# Patient Record
Sex: Female | Born: 1960 | Race: White | Hispanic: No | Smoking: Current every day smoker
Health system: Southern US, Community
[De-identification: ages and names within clinical notes are randomized; demographics above are authoritative.]

## PROBLEM LIST (undated history)

## (undated) DIAGNOSIS — R0602 Shortness of breath: Secondary | ICD-10-CM

## (undated) DIAGNOSIS — M1990 Unspecified osteoarthritis, unspecified site: Secondary | ICD-10-CM

## (undated) DIAGNOSIS — R32 Unspecified urinary incontinence: Secondary | ICD-10-CM

## (undated) DIAGNOSIS — F32A Depression, unspecified: Secondary | ICD-10-CM

## (undated) DIAGNOSIS — F419 Anxiety disorder, unspecified: Secondary | ICD-10-CM

## (undated) DIAGNOSIS — G43909 Migraine, unspecified, not intractable, without status migrainosus: Secondary | ICD-10-CM

## (undated) DIAGNOSIS — K219 Gastro-esophageal reflux disease without esophagitis: Secondary | ICD-10-CM

## (undated) DIAGNOSIS — F329 Major depressive disorder, single episode, unspecified: Secondary | ICD-10-CM

## (undated) HISTORY — PX: TUBAL LIGATION: SHX77

## (undated) HISTORY — PX: TURBINATE RESECTION: SHX293

## (undated) HISTORY — PX: JOINT REPLACEMENT: SHX530

---

## 2001-03-02 ENCOUNTER — Encounter: Payer: Self-pay | Admitting: Internal Medicine

## 2001-03-02 ENCOUNTER — Encounter: Admission: RE | Admit: 2001-03-02 | Discharge: 2001-03-02 | Payer: Self-pay | Admitting: Internal Medicine

## 2001-03-10 ENCOUNTER — Emergency Department (HOSPITAL_COMMUNITY): Admission: EM | Admit: 2001-03-10 | Discharge: 2001-03-10 | Payer: Self-pay | Admitting: Emergency Medicine

## 2003-10-15 ENCOUNTER — Emergency Department (HOSPITAL_COMMUNITY): Admission: EM | Admit: 2003-10-15 | Discharge: 2003-10-15 | Payer: Self-pay

## 2008-06-06 HISTORY — PX: GREAT TOE ARTHRODESIS, INTERPHALANGEAL JOINT: SUR55

## 2010-11-15 ENCOUNTER — Emergency Department (HOSPITAL_COMMUNITY)
Admission: EM | Admit: 2010-11-15 | Discharge: 2010-11-15 | Disposition: A | Discharge status: Home / Self Care | Payer: BC Managed Care – PPO | Attending: Emergency Medicine | Admitting: Emergency Medicine

## 2010-11-15 ENCOUNTER — Emergency Department (HOSPITAL_COMMUNITY): Payer: BC Managed Care – PPO

## 2010-11-15 DIAGNOSIS — F329 Major depressive disorder, single episode, unspecified: Secondary | ICD-10-CM | POA: Insufficient documentation

## 2010-11-15 DIAGNOSIS — F3289 Other specified depressive episodes: Secondary | ICD-10-CM | POA: Insufficient documentation

## 2010-11-15 DIAGNOSIS — Z79899 Other long term (current) drug therapy: Secondary | ICD-10-CM | POA: Insufficient documentation

## 2010-11-15 DIAGNOSIS — R110 Nausea: Secondary | ICD-10-CM | POA: Insufficient documentation

## 2010-11-15 DIAGNOSIS — N12 Tubulo-interstitial nephritis, not specified as acute or chronic: Secondary | ICD-10-CM | POA: Insufficient documentation

## 2010-11-15 LAB — POCT PREGNANCY, URINE: Preg Test, Ur: NEGATIVE

## 2010-11-15 LAB — CBC
HCT: 37.7 % (ref 36.0–46.0)
Hemoglobin: 13 g/dL (ref 12.0–15.0)
MCH: 31.9 pg (ref 26.0–34.0)
MCHC: 34.5 g/dL (ref 30.0–36.0)
MCV: 92.6 fL (ref 78.0–100.0)
Platelets: 192 10*3/uL (ref 150–400)
RBC: 4.07 MIL/uL (ref 3.87–5.11)
RDW: 13.3 % (ref 11.5–15.5)
WBC: 11.6 10*3/uL — ABNORMAL HIGH (ref 4.0–10.5)

## 2010-11-15 LAB — COMPREHENSIVE METABOLIC PANEL
ALT: 10 U/L (ref 0–35)
AST: 19 U/L (ref 0–37)
Albumin: 3.5 g/dL (ref 3.5–5.2)
Alkaline Phosphatase: 77 U/L (ref 39–117)
BUN: 8 mg/dL (ref 6–23)
CO2: 23 mEq/L (ref 19–32)
Calcium: 9.1 mg/dL (ref 8.4–10.5)
Chloride: 103 mEq/L (ref 96–112)
Creatinine, Ser: 0.6 mg/dL (ref 0.4–1.2)
GFR calc Af Amer: >60 mL/min (ref >60)
GFR calc non Af Amer: >60 mL/min (ref >60)
Glucose, Bld: 92 mg/dL (ref 70–99)
Potassium: 3.6 mEq/L (ref 3.5–5.1)
Sodium: 136 mEq/L (ref 135–145)
Total Bilirubin: 0.3 mg/dL (ref 0.3–1.2)
Total Protein: 6.7 g/dL (ref 6.0–8.3)

## 2010-11-15 LAB — URINE MICROSCOPIC-ADD ON

## 2010-11-15 LAB — URINALYSIS, ROUTINE W REFLEX MICROSCOPIC
Bilirubin Urine: NEGATIVE
Glucose, UA: NEGATIVE mg/dL
Ketones, ur: NEGATIVE mg/dL
Nitrite: NEGATIVE
Protein, ur: NEGATIVE mg/dL
Specific Gravity, Urine: 1.008 (ref 1.005–1.030)
Urobilinogen, UA: 0.2 mg/dL (ref 0.0–1.0)
pH: 6.5 (ref 5.0–8.0)

## 2010-11-15 LAB — LIPASE, BLOOD: Lipase: 17 U/L (ref 11–59)

## 2010-11-15 MED ORDER — IOHEXOL 300 MG/ML  SOLN
100.0000 mL | Freq: Once | INTRAMUSCULAR | Status: AC | PRN
  Administered 2010-11-15: 100 mL via INTRAVENOUS

## 2010-11-17 LAB — URINE CULTURE
Colony Count: >=100000
Culture  Setup Time: 201206111240

## 2012-03-27 ENCOUNTER — Encounter (HOSPITAL_COMMUNITY): Payer: Self-pay | Admitting: Emergency Medicine

## 2012-03-27 ENCOUNTER — Emergency Department (HOSPITAL_COMMUNITY)
Admission: EM | Admit: 2012-03-27 | Discharge: 2012-03-28 | Disposition: A | Discharge status: Home / Self Care | Payer: BC Managed Care – PPO | Attending: Emergency Medicine | Admitting: Emergency Medicine

## 2012-03-27 DIAGNOSIS — Z79899 Other long term (current) drug therapy: Secondary | ICD-10-CM | POA: Insufficient documentation

## 2012-03-27 DIAGNOSIS — R197 Diarrhea, unspecified: Secondary | ICD-10-CM

## 2012-03-27 DIAGNOSIS — F3289 Other specified depressive episodes: Secondary | ICD-10-CM | POA: Insufficient documentation

## 2012-03-27 DIAGNOSIS — F329 Major depressive disorder, single episode, unspecified: Secondary | ICD-10-CM | POA: Insufficient documentation

## 2012-03-27 DIAGNOSIS — R1084 Generalized abdominal pain: Secondary | ICD-10-CM | POA: Insufficient documentation

## 2012-03-27 DIAGNOSIS — R109 Unspecified abdominal pain: Secondary | ICD-10-CM

## 2012-03-27 DIAGNOSIS — R112 Nausea with vomiting, unspecified: Secondary | ICD-10-CM

## 2012-03-27 DIAGNOSIS — K219 Gastro-esophageal reflux disease without esophagitis: Secondary | ICD-10-CM | POA: Insufficient documentation

## 2012-03-27 HISTORY — DX: Major depressive disorder, single episode, unspecified: F32.9

## 2012-03-27 HISTORY — DX: Depression, unspecified: F32.A

## 2012-03-27 HISTORY — DX: Gastro-esophageal reflux disease without esophagitis: K21.9

## 2012-03-27 LAB — COMPREHENSIVE METABOLIC PANEL
ALT: 19 U/L (ref 0–35)
AST: 26 U/L (ref 0–37)
Albumin: 4.1 g/dL (ref 3.5–5.2)
Alkaline Phosphatase: 78 U/L (ref 39–117)
BUN: 11 mg/dL (ref 6–23)
CO2: 27 mEq/L (ref 19–32)
Calcium: 9.7 mg/dL (ref 8.4–10.5)
Chloride: 102 mEq/L (ref 96–112)
Creatinine, Ser: 0.75 mg/dL (ref 0.50–1.10)
GFR calc Af Amer: >90 mL/min (ref >90)
GFR calc non Af Amer: >90 mL/min (ref >90)
Glucose, Bld: 106 mg/dL — ABNORMAL HIGH (ref 70–99)
Potassium: 3.6 mEq/L (ref 3.5–5.1)
Sodium: 138 mEq/L (ref 135–145)
Total Bilirubin: 0.2 mg/dL — ABNORMAL LOW (ref 0.3–1.2)
Total Protein: 7.3 g/dL (ref 6.0–8.3)

## 2012-03-27 LAB — CBC WITH DIFFERENTIAL/PLATELET
Basophils Absolute: 0 10*3/uL (ref 0.0–0.1)
Basophils Relative: 0 % (ref 0–1)
Eosinophils Absolute: 0.1 10*3/uL (ref 0.0–0.7)
Eosinophils Relative: 1 % (ref 0–5)
HCT: 40.7 % (ref 36.0–46.0)
Hemoglobin: 14.1 g/dL (ref 12.0–15.0)
Lymphocytes Relative: 8 % — ABNORMAL LOW (ref 12–46)
Lymphs Abs: 1.5 10*3/uL (ref 0.7–4.0)
MCH: 32.6 pg (ref 26.0–34.0)
MCHC: 34.6 g/dL (ref 30.0–36.0)
MCV: 94 fL (ref 78.0–100.0)
Monocytes Absolute: 1.7 10*3/uL — ABNORMAL HIGH (ref 0.1–1.0)
Monocytes Relative: 9 % (ref 3–12)
Neutro Abs: 16.2 10*3/uL — ABNORMAL HIGH (ref 1.7–7.7)
Neutrophils Relative %: 83 % — ABNORMAL HIGH (ref 43–77)
Platelets: 248 10*3/uL (ref 150–400)
RBC: 4.33 MIL/uL (ref 3.87–5.11)
RDW: 13.2 % (ref 11.5–15.5)
WBC: 19.5 10*3/uL — ABNORMAL HIGH (ref 4.0–10.5)

## 2012-03-27 LAB — LIPASE, BLOOD: Lipase: 26 U/L (ref 11–59)

## 2012-03-27 MED ORDER — HYDROMORPHONE HCL PF 1 MG/ML IJ SOLN
1.0000 mg | Freq: Once | INTRAMUSCULAR | Status: AC
  Administered 2012-03-27: 1 mg via INTRAVENOUS
  Filled 2012-03-27: qty 1

## 2012-03-27 MED ORDER — PROMETHAZINE HCL 25 MG/ML IJ SOLN
25.0000 mg | Freq: Once | INTRAMUSCULAR | Status: AC
  Administered 2012-03-27: 25 mg via INTRAVENOUS
  Filled 2012-03-27: qty 1

## 2012-03-27 MED ORDER — DICYCLOMINE HCL 10 MG/ML IM SOLN
20.0000 mg | Freq: Once | INTRAMUSCULAR | Status: AC
  Administered 2012-03-27: 20 mg via INTRAMUSCULAR
  Filled 2012-03-27: qty 2

## 2012-03-27 NOTE — ED Notes (Addendum)
Pt is aware of the need for urine sample, however states she is unable to provide one at this time.  

## 2012-03-27 NOTE — ED Notes (Signed)
[id number]<AV> Expected date:<BR> Expected time:<BR> Means of arrival:<BR> Comments:<BR> EMS/N/V/D

## 2012-03-27 NOTE — ED Provider Notes (Signed)
History     CSN: [id number]  Arrival date & time 03/27/12  2155   First MD Initiated Contact with Patient 03/27/12 2231      Chief Complaint  Patient presents with  . Emesis  . Diarrhea  . Abdominal Pain    (Consider location/radiation/quality/duration/timing/severity/associated sxs/prior treatment) HPI MCKINZEE JOURDEN is a 51 y.o. female presenting with nausea vomiting and diarrhea. She's also been associated abdominal pain. Patient works at [employer] in a not been feeling good all afternoon. She AF for lunch and about 1500 started having some nauseous feelings which point she took some Pepto-Bismol and promptly vomited. Since then she's had 10/10 crampy knifelike pain diffusely throughout her abdomen is been constant, she's had no blood in the vomit or diarrhea, she had 8-9 episodes of emesis, and 5 episodes of diarrhea. No chest pain, short of breath, fevers, chills.  Past Medical History  Diagnosis Date  . Depression   . GERD (gastroesophageal reflux disease)     History reviewed. No pertinent past surgical history.  No family history on file.  History  Substance Use Topics  . Smoking status: Not on file  . Smokeless tobacco: Not on file  . Alcohol Use:     OB History    Grav Para Term Preterm Abortions TAB SAB Ect Mult Living                  Review of Systems At least 10pt or greater review of systems completed and are negative except where specified in the HPI.  Allergies  Sulfa antibiotics  Home Medications   Current Outpatient Rx  Name Route Sig Dispense Refill  . BUPROPION HCL ER (SR) 150 MG PO TB12 Oral Take 150 mg by mouth 2 (two) times daily.    Marland Kitchen LANSOPRAZOLE 30 MG PO CPDR Oral Take 30 mg by mouth daily.    Marland Kitchen LORAZEPAM 1 MG PO TABS Oral Take 0.5 mg by mouth at bedtime as needed.    . OXYBUTYNIN CHLORIDE ER 10 MG PO TB24 Oral Take 10 mg by mouth 2 (two) times daily.    Marland Kitchen PAROXETINE HCL 20 MG PO TABS Oral Take 20 mg by mouth every  morning.      BP 143/80  Pulse 63  Temp 97.6 F (36.4 C) (Oral)  Resp 20  SpO2 99%  Physical Exam  Nursing notes reviewed.  Electronic medical record reviewed. VITAL SIGNS:   Filed Vitals:   03/27/12 2201  BP: 143/80  Pulse: 63  Temp: 97.6 F (36.4 C)  TempSrc: Oral  Resp: 20  SpO2: 99%   CONSTITUTIONAL: Awake, oriented, appears non-toxic HENT: Atraumatic, normocephalic, oral mucosa pink and moist, airway patent. Nares patent without drainage. External ears normal. EYES: Conjunctiva clear, EOMI, PERRLA NECK: Trachea midline, non-tender, supple CARDIOVASCULAR: Normal heart rate, Normal rhythm, No murmurs, rubs, gallops PULMONARY/CHEST: Clear to auscultation, no rhonchi, wheezes, or rales. Symmetrical breath sounds. Non-tender. ABDOMINAL: Non-distended, soft, non-tender - no rebound or guarding.  BS normal. NEUROLOGIC: Non-focal, moving all four extremities, no gross sensory or motor deficits. EXTREMITIES: No clubbing, cyanosis, or edema SKIN: Warm, Dry, No erythema, No rash  ED Course  Procedures (including critical care time)   Labs Reviewed  URINALYSIS, MICROSCOPIC ONLY  CBC WITH DIFFERENTIAL  COMPREHENSIVE METABOLIC PANEL  LIPASE, BLOOD   No results found.   1. Nausea and vomiting   2. Diarrhea   3. Abdominal cramping       MDM  Silas Flood [facility]  is a 51 y.o. female setting with likely gastroenteritis. Patient's abdomen is benign, she is nontoxic in appearance but certainly looks uncomfortable. After treatment with antiemetics and pain medicine, she's feeling much better like to go home. She does have an elevated white count of 19.5 however after extended out some vomiting I think this is likely nonspecific with a benign abdomen. CMP is unremarkable as is her urinalysis.  I explained the diagnosis and have given explicit precautions to return to the ER including appendicitis or any other new or worsening symptoms. The patient understands and accepts the  medical plan as it's been dictated and I have answered their questions. Discharge instructions concerning home care and prescriptions have been given.  The patient is STABLE and is discharged to home in good condition.          Jones Skene, MD 03/29/12 2030

## 2012-03-27 NOTE — ED Notes (Addendum)
Report given via EMS. Pt c/o N/V/D that started at 1900. Mid lower abdominal pain that started going down right leg. Initial VS 162 palpated HR 82 RR 22 O2 98% RA. 20 gauge right hand. 4 mg Zofran given en route. NSR on monitor.

## 2012-03-28 LAB — URINALYSIS, MICROSCOPIC ONLY
Bilirubin Urine: NEGATIVE
Glucose, UA: NEGATIVE mg/dL
Hgb urine dipstick: NEGATIVE
Ketones, ur: NEGATIVE mg/dL
Leukocytes, UA: NEGATIVE
Nitrite: NEGATIVE
Protein, ur: NEGATIVE mg/dL
Specific Gravity, Urine: 1.021 (ref 1.005–1.030)
Urobilinogen, UA: 0.2 mg/dL (ref 0.0–1.0)
pH: 7.5 (ref 5.0–8.0)

## 2012-03-28 MED ORDER — HYDROCODONE-ACETAMINOPHEN 5-325 MG PO TABS: 1.0000 | ORAL_TABLET | ORAL | Status: DC | PRN

## 2012-03-28 MED ORDER — PROMETHAZINE HCL 25 MG PO TABS: 25.0000 mg | ORAL_TABLET | Freq: Four times a day (QID) | ORAL | Status: DC | PRN

## 2012-08-29 ENCOUNTER — Telehealth: Payer: Self-pay | Admitting: Physician Assistant

## 2012-08-29 NOTE — Telephone Encounter (Signed)
NTBS so can decide appropriate dose of prednisone etc

## 2012-08-30 NOTE — Telephone Encounter (Signed)
Returned call to patient and recommended office visit.  She says she can not come in this week because of work.  Will continue to use topical cream she has at home.   if not better by Monday will make appt.

## 2012-09-19 ENCOUNTER — Telehealth: Payer: Self-pay | Admitting: Physician Assistant

## 2012-09-19 NOTE — Telephone Encounter (Signed)
Last rf 08/20/12.  Last ov 05/18/12.  Lorazepam 0.5mg  tid prn Need approval for controlled medication.

## 2012-09-19 NOTE — Telephone Encounter (Signed)
Approved. Give # 90 with 2 additional refills

## 2012-09-20 MED ORDER — LORAZEPAM 1 MG PO TABS: 0.5000 mg | ORAL_TABLET | Freq: Three times a day (TID) | ORAL | Status: DC | PRN

## 2012-09-20 NOTE — Telephone Encounter (Signed)
Medication refilled per protocol. 

## 2012-10-05 ENCOUNTER — Other Ambulatory Visit: Payer: Self-pay | Admitting: Family Medicine

## 2012-11-03 ENCOUNTER — Other Ambulatory Visit: Payer: Self-pay | Admitting: Family Medicine

## 2012-12-26 ENCOUNTER — Other Ambulatory Visit: Payer: Self-pay | Admitting: Physician Assistant

## 2012-12-27 ENCOUNTER — Encounter: Payer: Self-pay | Admitting: Family Medicine

## 2012-12-27 NOTE — Telephone Encounter (Signed)
Last refill 09/20/12 #90 + 2 refills  Is due for office visit.  Letter sent to schedule appt Need approval for controlled medication.Marland Kitchen

## 2012-12-27 NOTE — Telephone Encounter (Signed)
Rx called in #90 NO refills

## 2012-12-27 NOTE — Telephone Encounter (Signed)
Reviewed Chart. Approved. #90/0.

## 2013-01-14 ENCOUNTER — Ambulatory Visit (INDEPENDENT_AMBULATORY_CARE_PROVIDER_SITE_OTHER): Payer: BC Managed Care – PPO | Admitting: Physician Assistant

## 2013-01-14 ENCOUNTER — Encounter: Payer: Self-pay | Admitting: Physician Assistant

## 2013-01-14 VITALS — BP 124/80 | HR 80 | Temp 98.4°F | Resp 18 | Wt 132.0 lb

## 2013-01-14 DIAGNOSIS — H6692 Otitis media, unspecified, left ear: Secondary | ICD-10-CM

## 2013-01-14 DIAGNOSIS — H6690 Otitis media, unspecified, unspecified ear: Secondary | ICD-10-CM

## 2013-01-14 MED ORDER — AMOXICILLIN-POT CLAVULANATE 875-125 MG PO TABS: 1.0000 | ORAL_TABLET | Freq: Two times a day (BID) | ORAL | Status: DC

## 2013-01-14 NOTE — Progress Notes (Signed)
Patient ID: Kristy Butler MRN: [id number], DOB: [birth year], 52 y.o. Date of Encounter: 01/14/2013, 4:28 PM    Chief Complaint:  Chief Complaint  Patient presents with  . c/o bilat ear infections     HPI: 52 y.o. year old white female here with c/o ear ache.  Says left ear started bothering her last week. That has gottne worse. Now, right ear starting to feel the same as the left one.  Has had no mucus from nose, no rhinorrhea, no cough or chest congestion, no fever/chills, no sore thraot.  Has seen no drainage from ear.   Home Meds: See attached medication section for any medications that were entered at today's visit. The computer does not put those onto this list.The following list is a list of meds entered prior to today's visit.   Current Outpatient Prescriptions on File Prior to Visit  Medication Sig Dispense Refill  . buPROPion (WELLBUTRIN SR) 150 MG 12 hr tablet Take 150 mg by mouth 2 (two) times daily.      . lansoprazole (PREVACID) 30 MG capsule Take 30 mg by mouth daily.      Marland Kitchen LORazepam (ATIVAN) 0.5 MG tablet TAKE 1 TABLET 3 TIMES A DAY AS NEEDED FOR ANXIETY  90 tablet  0  . meloxicam (MOBIC) 7.5 MG tablet TAKE 1 TO 2 TABLETS BY MOUTH EVERY DAY AS NEEDED FOR JOINT PAIN  60 tablet  1  . omeprazole (PRILOSEC) 20 MG capsule TAKE 1 CAPSULE BY MOUTH DAILY  30 capsule  10  . oxybutynin (DITROPAN-XL) 10 MG 24 hr tablet Take 10 mg by mouth 2 (two) times daily.      Marland Kitchen PARoxetine (PAXIL) 20 MG tablet Take 20 mg by mouth every morning.      Marland Kitchen HYDROcodone-acetaminophen (NORCO/VICODIN) 5-325 MG per tablet Take 1-2 tablets by mouth every 4 (four) hours as needed for pain.  13 tablet  0  . promethazine (PHENERGAN) 25 MG tablet Take 1 tablet (25 mg total) by mouth every 6 (six) hours as needed for nausea.  15 tablet  0   No current facility-administered medications on file prior to visit.    Allergies:  Allergies  Allergen Reactions  . Sulfa Antibiotics       Review of Systems:  See HPI for pertinent ROS. All other ROS negative.    Physical Exam: Blood pressure 124/80, pulse 80, temperature 98.4 F (36.9 C), temperature source Oral, resp. rate 18, weight 132 lb (59.875 kg)., There is no height on file to calculate BMI. General: WNWD WF.  Appears in no acute distress. HEENT: Normocephalic, atraumatic, eyes without discharge, sclera non-icteric, nares are without discharge.External Ears: No tenderness with palpation. No erythema or edema of external ear.  Bilateral auditory canals clear with no edema, no erythema, TM's are without perforation. Left TM: There is streak of bright red erythema. The TM is dull, golden, c/w effusion. Right TM is clearer but also has streak of bright red erythema. Oral cavity moist, posterior pharynx without exudate, erythema, peritonsillar abscess, or post nasal drip.  Neck: Supple. No thyromegaly. No lymphadenopathy. Lungs: Clear bilaterally to auscultation without wheezes, rales, or rhonchi. Breathing is unlabored. Heart: Regular rhythm. No murmurs, rubs, or gallops. Msk:  Strength and tone normal for age. Extremities/Skin: Warm and dry.  No edema. No rashes or suspicious lesions. Neuro: Alert and oriented X 3. Moves all extremities spontaneously. Gait is normal. CNII-XII grossly in tact. Psych:  Responds to questions appropriately with a normal affect.  ASSESSMENT AND PLAN:  52 y.o. year old female with  1. Otitis media, left - amoxicillin-clavulanate (AUGMENTIN) 875-125 MG per tablet; Take 1 tablet by mouth 2 (two) times daily.  Dispense: 20 tablet; Refill: 0 Pt says she has no h/o ear infections. Take all of abx. Take otc decongestant. If sx do not resolve, f/u.   Murray Hodgkins [place], [place], [facility] 01/14/2013 4:28 PM

## 2013-01-23 ENCOUNTER — Telehealth: Payer: Self-pay | Admitting: Family Medicine

## 2013-01-23 MED ORDER — CEFDINIR 300 MG PO CAPS: 300.0000 mg | ORAL_CAPSULE | Freq: Two times a day (BID) | ORAL | Status: DC

## 2013-01-23 NOTE — Telephone Encounter (Signed)
Pt called and RX to pharmacy 

## 2013-01-23 NOTE — Telephone Encounter (Signed)
Take Omnicef. If symptoms do not Comploetely Resolve with this, then schedule f/u OV to re-examine.  Rx: Omnicef 300mg  one po BID x 10 days. # 20 / 0

## 2013-01-28 ENCOUNTER — Other Ambulatory Visit: Payer: Self-pay | Admitting: Physician Assistant

## 2013-01-28 NOTE — Telephone Encounter (Signed)
Approved for # 90 plus ONE additional refill but tell pt this should last her 2 months.

## 2013-01-28 NOTE — Telephone Encounter (Signed)
rx called in

## 2013-01-28 NOTE — Telephone Encounter (Signed)
Last OV for this pre-Epic.  Last refill 7/23 #90  OK refill ?

## 2013-04-01 ENCOUNTER — Encounter (HOSPITAL_COMMUNITY): Payer: Self-pay | Admitting: Emergency Medicine

## 2013-04-01 ENCOUNTER — Emergency Department (HOSPITAL_COMMUNITY)
Admission: EM | Admit: 2013-04-01 | Discharge: 2013-04-01 | Disposition: A | Discharge status: Home / Self Care | Payer: BC Managed Care – PPO | Attending: Emergency Medicine | Admitting: Emergency Medicine

## 2013-04-01 ENCOUNTER — Emergency Department (HOSPITAL_COMMUNITY): Payer: BC Managed Care – PPO

## 2013-04-01 DIAGNOSIS — M79609 Pain in unspecified limb: Secondary | ICD-10-CM

## 2013-04-01 DIAGNOSIS — R209 Unspecified disturbances of skin sensation: Secondary | ICD-10-CM | POA: Insufficient documentation

## 2013-04-01 DIAGNOSIS — M7989 Other specified soft tissue disorders: Secondary | ICD-10-CM | POA: Insufficient documentation

## 2013-04-01 DIAGNOSIS — R110 Nausea: Secondary | ICD-10-CM | POA: Insufficient documentation

## 2013-04-01 DIAGNOSIS — Z87448 Personal history of other diseases of urinary system: Secondary | ICD-10-CM | POA: Insufficient documentation

## 2013-04-01 DIAGNOSIS — F17200 Nicotine dependence, unspecified, uncomplicated: Secondary | ICD-10-CM | POA: Insufficient documentation

## 2013-04-01 DIAGNOSIS — H9209 Otalgia, unspecified ear: Secondary | ICD-10-CM | POA: Insufficient documentation

## 2013-04-01 DIAGNOSIS — Z882 Allergy status to sulfonamides status: Secondary | ICD-10-CM | POA: Insufficient documentation

## 2013-04-01 DIAGNOSIS — K219 Gastro-esophageal reflux disease without esophagitis: Secondary | ICD-10-CM | POA: Insufficient documentation

## 2013-04-01 DIAGNOSIS — M129 Arthropathy, unspecified: Secondary | ICD-10-CM | POA: Insufficient documentation

## 2013-04-01 DIAGNOSIS — F3289 Other specified depressive episodes: Secondary | ICD-10-CM | POA: Insufficient documentation

## 2013-04-01 DIAGNOSIS — F411 Generalized anxiety disorder: Secondary | ICD-10-CM | POA: Insufficient documentation

## 2013-04-01 DIAGNOSIS — M79602 Pain in left arm: Secondary | ICD-10-CM

## 2013-04-01 DIAGNOSIS — Z8709 Personal history of other diseases of the respiratory system: Secondary | ICD-10-CM | POA: Insufficient documentation

## 2013-04-01 DIAGNOSIS — Z8669 Personal history of other diseases of the nervous system and sense organs: Secondary | ICD-10-CM | POA: Insufficient documentation

## 2013-04-01 DIAGNOSIS — Z79899 Other long term (current) drug therapy: Secondary | ICD-10-CM | POA: Insufficient documentation

## 2013-04-01 DIAGNOSIS — F329 Major depressive disorder, single episode, unspecified: Secondary | ICD-10-CM | POA: Insufficient documentation

## 2013-04-01 LAB — CBC WITH DIFFERENTIAL/PLATELET
Basophils Absolute: 0 10*3/uL (ref 0.0–0.1)
Basophils Relative: 0 % (ref 0–1)
Eosinophils Absolute: 0.1 10*3/uL (ref 0.0–0.7)
Eosinophils Relative: 1 % (ref 0–5)
HCT: 35.7 % — ABNORMAL LOW (ref 36.0–46.0)
Hemoglobin: 12.3 g/dL (ref 12.0–15.0)
Lymphocytes Relative: 26 % (ref 12–46)
Lymphs Abs: 2.6 10*3/uL (ref 0.7–4.0)
MCH: 33 pg (ref 26.0–34.0)
MCHC: 34.5 g/dL (ref 30.0–36.0)
MCV: 95.7 fL (ref 78.0–100.0)
Monocytes Absolute: 0.7 10*3/uL (ref 0.1–1.0)
Monocytes Relative: 7 % (ref 3–12)
Neutro Abs: 6.5 10*3/uL (ref 1.7–7.7)
Neutrophils Relative %: 65 % (ref 43–77)
Platelets: 191 10*3/uL (ref 150–400)
RBC: 3.73 MIL/uL — ABNORMAL LOW (ref 3.87–5.11)
RDW: 13.3 % (ref 11.5–15.5)
WBC: 9.9 10*3/uL (ref 4.0–10.5)

## 2013-04-01 LAB — BASIC METABOLIC PANEL
BUN: 19 mg/dL (ref 6–23)
CO2: 29 mEq/L (ref 19–32)
Calcium: 8.9 mg/dL (ref 8.4–10.5)
Chloride: 105 mEq/L (ref 96–112)
Creatinine, Ser: 1.02 mg/dL (ref 0.50–1.10)
GFR calc Af Amer: 72 mL/min — ABNORMAL LOW (ref >90)
GFR calc non Af Amer: 62 mL/min — ABNORMAL LOW (ref >90)
Glucose, Bld: 93 mg/dL (ref 70–99)
Potassium: 3.6 mEq/L (ref 3.5–5.1)
Sodium: 139 mEq/L (ref 135–145)

## 2013-04-01 LAB — TROPONIN I
Troponin I: <0.3 ng/mL (ref <0.30)
Troponin I: <0.3 ng/mL (ref <0.30)

## 2013-04-01 MED ORDER — DEXAMETHASONE SODIUM PHOSPHATE 10 MG/ML IJ SOLN
10.0000 mg | Freq: Once | INTRAMUSCULAR | Status: AC
  Administered 2013-04-01: 10 mg via INTRAVENOUS
  Filled 2013-04-01: qty 1

## 2013-04-01 MED ORDER — OXYCODONE-ACETAMINOPHEN 5-325 MG PO TABS
1.0000 | ORAL_TABLET | Freq: Once | ORAL | Status: AC
  Administered 2013-04-01: 1 via ORAL
  Filled 2013-04-01: qty 1

## 2013-04-01 MED ORDER — SODIUM CHLORIDE 0.9 % IV BOLUS (SEPSIS)
1000.0000 mL | Freq: Once | INTRAVENOUS | Status: AC
  Administered 2013-04-01: 1000 mL via INTRAVENOUS

## 2013-04-01 MED ORDER — MORPHINE SULFATE 4 MG/ML IJ SOLN
4.0000 mg | Freq: Once | INTRAMUSCULAR | Status: AC
  Administered 2013-04-01: 4 mg via INTRAVENOUS
  Filled 2013-04-01: qty 1

## 2013-04-01 MED ORDER — ONDANSETRON HCL 4 MG/2ML IJ SOLN
4.0000 mg | Freq: Once | INTRAMUSCULAR | Status: AC
  Administered 2013-04-01: 4 mg via INTRAVENOUS
  Filled 2013-04-01: qty 2
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
## 2013-04-01 NOTE — ED Provider Notes (Signed)
Medical screening examination/treatment/procedure(s) were performed by non-physician practitioner and as supervising physician I was immediately available for consultation/collaboration.  EKG Interpretation     Ventricular Rate:  64 PR Interval:  146 QRS Duration: 80 QT Interval:  394 QTC Calculation: 406 R Axis:   79 Text Interpretation:  Normal sinus rhythm Nonspecific T wave abnormality Abnormal ECG              Shanna Cisco, MD 04/01/13 1610
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
## 2013-04-01 NOTE — ED Provider Notes (Signed)
  Physical Exam  BP 130/79  Pulse 69  Temp(Src) 98.1 F (36.7 C) (Oral)  Resp 20  Ht 5\' 6"  (1.676 m)  Wt 135 lb (61.236 kg)  BMI 21.8 kg/m2  SpO2 97%  Physical Exam  ED Course  Procedures  MDM Assumed care from Toy Baker PA-C at 4 PM please see her note for history of present illness antitussive care up until that point. Briefly this is a Caucasian female who comes to the emergency department today with left arm pain which is intermittent.  We are awaiting the results of a repeat troponin in the left upper extremity ultrasound. Left upper extremity ultrasound was negative for DVT. Repeat troponin was negative. Result doubt DVT or MI. Ms. Rohlman pain resolved completely while she was in the emergency department. She was felt to be stable for discharge. She was instructed to followup with her primary care physician within the next week for her arm pain. She was instructed to return to the emergency department if she develops exertional chest pain worsening arm pain or any other concerns. She expressed understanding she was discharged in stable condition labs and imaging reviewed by myself and considered and medical decision making.  Imaging was interpreted by radiology. Care was discussed with my attending.  Bethann Berkshire, MD 04/02/13 [phone]

## 2013-04-01 NOTE — ED Provider Notes (Signed)
52 year old female initially seen and evaluated by Marlon Pel PA-C for left arm numbness. Patient says the numbness is subsiding but still present. Troponins x2 and negative in May and venous Doppler study is negative There no neurologic deficits and pulses are strong and capillary refill is prompt. Because of arm numbness is not clear but no evidence of cardiac or vascular etiology.  I saw and evaluated the patient, reviewed the resident's note and I agree with the findings and plan.     Dione Booze, MD 04/01/13 [phone]

## 2013-04-01 NOTE — Progress Notes (Signed)
*  Preliminary Results* Left upper extremity venous duplex completed. Left upper extremity is negative for deep and superficial vein thrombosis.  04/01/2013 3:50 PM  Gertie Fey, RVT, RDCS, RDMS

## 2013-04-04 ENCOUNTER — Observation Stay (HOSPITAL_COMMUNITY)
Admission: EM | Admit: 2013-04-04 | Discharge: 2013-04-05 | Disposition: A | Discharge status: Home / Self Care | Payer: BC Managed Care – PPO | Attending: Internal Medicine | Admitting: Internal Medicine

## 2013-04-04 ENCOUNTER — Encounter (HOSPITAL_COMMUNITY): Payer: Self-pay | Admitting: Emergency Medicine

## 2013-04-04 ENCOUNTER — Other Ambulatory Visit: Payer: Self-pay | Admitting: Physician Assistant

## 2013-04-04 ENCOUNTER — Emergency Department (HOSPITAL_COMMUNITY): Payer: BC Managed Care – PPO

## 2013-04-04 ENCOUNTER — Encounter: Payer: Self-pay | Admitting: Family Medicine

## 2013-04-04 DIAGNOSIS — R0602 Shortness of breath: Secondary | ICD-10-CM | POA: Insufficient documentation

## 2013-04-04 DIAGNOSIS — R9431 Abnormal electrocardiogram [ECG] [EKG]: Principal | ICD-10-CM

## 2013-04-04 DIAGNOSIS — R079 Chest pain, unspecified: Secondary | ICD-10-CM

## 2013-04-04 DIAGNOSIS — R0789 Other chest pain: Secondary | ICD-10-CM | POA: Insufficient documentation

## 2013-04-04 DIAGNOSIS — K219 Gastro-esophageal reflux disease without esophagitis: Secondary | ICD-10-CM | POA: Insufficient documentation

## 2013-04-04 DIAGNOSIS — M542 Cervicalgia: Secondary | ICD-10-CM | POA: Insufficient documentation

## 2013-04-04 DIAGNOSIS — Z79899 Other long term (current) drug therapy: Secondary | ICD-10-CM | POA: Insufficient documentation

## 2013-04-04 DIAGNOSIS — F3289 Other specified depressive episodes: Secondary | ICD-10-CM | POA: Insufficient documentation

## 2013-04-04 DIAGNOSIS — R51 Headache: Secondary | ICD-10-CM | POA: Insufficient documentation

## 2013-04-04 DIAGNOSIS — R519 Headache, unspecified: Secondary | ICD-10-CM

## 2013-04-04 DIAGNOSIS — R110 Nausea: Secondary | ICD-10-CM | POA: Insufficient documentation

## 2013-04-04 DIAGNOSIS — F329 Major depressive disorder, single episode, unspecified: Secondary | ICD-10-CM | POA: Insufficient documentation

## 2013-04-04 DIAGNOSIS — F411 Generalized anxiety disorder: Secondary | ICD-10-CM | POA: Insufficient documentation

## 2013-04-04 DIAGNOSIS — F17200 Nicotine dependence, unspecified, uncomplicated: Secondary | ICD-10-CM | POA: Insufficient documentation

## 2013-04-04 HISTORY — DX: Shortness of breath: R06.02

## 2013-04-04 HISTORY — DX: Unspecified osteoarthritis, unspecified site: M19.90

## 2013-04-04 HISTORY — DX: Anxiety disorder, unspecified: F41.9

## 2013-04-04 HISTORY — DX: Migraine, unspecified, not intractable, without status migrainosus: G43.909

## 2013-04-04 HISTORY — DX: Unspecified urinary incontinence: R32

## 2013-04-04 LAB — CBC
HCT: 40.1 % (ref 36.0–46.0)
Hemoglobin: 13.6 g/dL (ref 12.0–15.0)
MCH: 32.5 pg (ref 26.0–34.0)
MCHC: 33.9 g/dL (ref 30.0–36.0)
MCV: 95.9 fL (ref 78.0–100.0)
Platelets: 207 10*3/uL (ref 150–400)
RBC: 4.18 MIL/uL (ref 3.87–5.11)
RDW: 13.5 % (ref 11.5–15.5)
WBC: 5.6 10*3/uL (ref 4.0–10.5)

## 2013-04-04 LAB — BASIC METABOLIC PANEL
BUN: 14 mg/dL (ref 6–23)
CO2: 27 mEq/L (ref 19–32)
Calcium: 9.2 mg/dL (ref 8.4–10.5)
Chloride: 105 mEq/L (ref 96–112)
Creatinine, Ser: 0.71 mg/dL (ref 0.50–1.10)
GFR calc Af Amer: >90 mL/min (ref >90)
GFR calc non Af Amer: >90 mL/min (ref >90)
Glucose, Bld: 110 mg/dL — ABNORMAL HIGH (ref 70–99)
Potassium: 3.3 mEq/L — ABNORMAL LOW (ref 3.5–5.1)
Sodium: 143 mEq/L (ref 135–145)

## 2013-04-04 LAB — RAPID URINE DRUG SCREEN, HOSP PERFORMED
Amphetamines: NOT DETECTED
Barbiturates: NOT DETECTED
Benzodiazepines: NOT DETECTED
Cocaine: NOT DETECTED
Opiates: POSITIVE — AB
Tetrahydrocannabinol: POSITIVE — AB

## 2013-04-04 LAB — LIPASE, BLOOD: Lipase: 22 U/L (ref 11–59)

## 2013-04-04 LAB — TROPONIN I
Troponin I: <0.3 ng/mL (ref <0.30)
Troponin I: <0.3 ng/mL (ref <0.30)

## 2013-04-04 LAB — POCT I-STAT TROPONIN I: Troponin i, poc: 0 ng/mL (ref 0.00–0.08)

## 2013-04-04 MED ORDER — MORPHINE SULFATE 2 MG/ML IJ SOLN
1.0000 mg | INTRAMUSCULAR | Status: DC | PRN
  Administered 2013-04-04: 1 mg via INTRAVENOUS
  Filled 2013-04-04: qty 1

## 2013-04-04 MED ORDER — BUPROPION HCL ER (SR) 150 MG PO TB12
150.0000 mg | ORAL_TABLET | Freq: Two times a day (BID) | ORAL | Status: DC
  Administered 2013-04-04 – 2013-04-05 (×2): 150 mg via ORAL
  Filled 2013-04-04 (×3): qty 1

## 2013-04-04 MED ORDER — ACETAMINOPHEN 325 MG PO TABS: 650.0000 mg | ORAL_TABLET | Freq: Four times a day (QID) | ORAL | Status: DC | PRN

## 2013-04-04 MED ORDER — POTASSIUM CHLORIDE CRYS ER 20 MEQ PO TBCR
40.0000 meq | EXTENDED_RELEASE_TABLET | Freq: Once | ORAL | Status: AC
  Administered 2013-04-04: 40 meq via ORAL
  Filled 2013-04-04: qty 2

## 2013-04-04 MED ORDER — ONDANSETRON HCL 4 MG PO TABS: 4.0000 mg | ORAL_TABLET | Freq: Four times a day (QID) | ORAL | Status: DC | PRN

## 2013-04-04 MED ORDER — HYDROMORPHONE HCL PF 1 MG/ML IJ SOLN: 1.0000 mg | INTRAMUSCULAR | Status: DC | PRN

## 2013-04-04 MED ORDER — LORAZEPAM 0.5 MG PO TABS: 0.5000 mg | ORAL_TABLET | Freq: Three times a day (TID) | ORAL | Status: DC | PRN

## 2013-04-04 MED ORDER — ACETAMINOPHEN 650 MG RE SUPP: 650.0000 mg | Freq: Four times a day (QID) | RECTAL | Status: DC | PRN

## 2013-04-04 MED ORDER — BUPROPION HCL ER (SR) 150 MG PO TB12: 150.0000 mg | ORAL_TABLET | Freq: Two times a day (BID) | ORAL | Status: DC

## 2013-04-04 MED ORDER — SODIUM CHLORIDE 0.9 % IV SOLN
INTRAVENOUS | Status: DC
  Administered 2013-04-04: 15:00:00 via INTRAVENOUS

## 2013-04-04 MED ORDER — SODIUM CHLORIDE 0.9 % IJ SOLN: 3.0000 mL | Freq: Two times a day (BID) | INTRAMUSCULAR | Status: DC

## 2013-04-04 MED ORDER — SODIUM CHLORIDE 0.9 % IV SOLN
1000.0000 mL | Freq: Once | INTRAVENOUS | Status: AC
  Administered 2013-04-04: 1000 mL via INTRAVENOUS

## 2013-04-04 MED ORDER — PANTOPRAZOLE SODIUM 40 MG IV SOLR
40.0000 mg | Freq: Two times a day (BID) | INTRAVENOUS | Status: DC
  Administered 2013-04-04 – 2013-04-05 (×3): 40 mg via INTRAVENOUS
  Filled 2013-04-04 (×5): qty 40

## 2013-04-04 MED ORDER — ENOXAPARIN SODIUM 40 MG/0.4ML ~~LOC~~ SOLN: 40.0000 mg | SUBCUTANEOUS | Status: DC

## 2013-04-04 MED ORDER — NITROGLYCERIN 0.4 MG SL SUBL
0.4000 mg | SUBLINGUAL_TABLET | SUBLINGUAL | Status: DC | PRN
  Administered 2013-04-04 (×2): 0.4 mg via SUBLINGUAL

## 2013-04-04 MED ORDER — OXYBUTYNIN CHLORIDE ER 10 MG PO TB24
10.0000 mg | ORAL_TABLET | Freq: Two times a day (BID) | ORAL | Status: DC
  Administered 2013-04-04 – 2013-04-05 (×2): 10 mg via ORAL
  Filled 2013-04-04 (×3): qty 1

## 2013-04-04 MED ORDER — LORAZEPAM 2 MG/ML IJ SOLN
1.0000 mg | Freq: Once | INTRAMUSCULAR | Status: AC
  Administered 2013-04-04: 1 mg via INTRAVENOUS
  Filled 2013-04-04: qty 1

## 2013-04-04 MED ORDER — INFLUENZA VAC SPLIT QUAD 0.5 ML IM SUSP
0.5000 mL | INTRAMUSCULAR | Status: AC
  Administered 2013-04-05: 0.5 mL via INTRAMUSCULAR
  Filled 2013-04-04: qty 0.5

## 2013-04-04 MED ORDER — ONDANSETRON HCL 4 MG/2ML IJ SOLN: 4.0000 mg | Freq: Three times a day (TID) | INTRAMUSCULAR | Status: DC | PRN

## 2013-04-04 MED ORDER — ONDANSETRON HCL 4 MG/2ML IJ SOLN: 4.0000 mg | Freq: Four times a day (QID) | INTRAMUSCULAR | Status: DC | PRN

## 2013-04-04 MED ORDER — PAROXETINE HCL 20 MG PO TABS
20.0000 mg | ORAL_TABLET | Freq: Every day | ORAL | Status: DC
  Administered 2013-04-05: 20 mg via ORAL
  Filled 2013-04-04: qty 1

## 2013-04-04 MED ORDER — HYDROCODONE-ACETAMINOPHEN 5-325 MG PO TABS
1.0000 | ORAL_TABLET | ORAL | Status: DC | PRN
  Administered 2013-04-04: 2 via ORAL
  Filled 2013-04-04: qty 2

## 2013-04-04 MED ORDER — ASPIRIN 81 MG PO CHEW
324.0000 mg | CHEWABLE_TABLET | Freq: Once | ORAL | Status: AC
  Administered 2013-04-04: 324 mg via ORAL
  Filled 2013-04-04: qty 4

## 2013-04-04 MED ORDER — PNEUMOCOCCAL VAC POLYVALENT 25 MCG/0.5ML IJ INJ
0.5000 mL | INJECTION | INTRAMUSCULAR | Status: AC
  Administered 2013-04-05: 0.5 mL via INTRAMUSCULAR
  Filled 2013-04-04: qty 0.5

## 2013-04-04 NOTE — ED Provider Notes (Signed)
Complains of left arm pain upon awakening 6:30 PM today pain radiates to left posterior neck and anterior chest. Seen here 1027 for similar complaint. Discomfort returned today. She says pain-free after one sublingual nitroglycerin administered here. Patient with minimal inferior ischemic EKG changes new from previous tracing. Heart score equals 4  Doug Sou, MD 04/04/13 [phone]

## 2013-04-04 NOTE — Telephone Encounter (Signed)
Okay to fill x1 but make sure that she has follow up prior to any further refills after this.

## 2013-04-04 NOTE — Telephone Encounter (Signed)
RX's called in  

## 2013-04-04 NOTE — ED Provider Notes (Signed)
Medical screening examination/treatment/procedure(s) were conducted as a shared visit with non-physician practitioner(s) and myself.  I personally evaluated the patient during the encounter.  EKG Interpretation     Ventricular Rate:  79 PR Interval:  144 QRS Duration: 88 QT Interval:  358 QTC Calculation: 410 R Axis:   83 Text Interpretation:  Normal sinus rhythm with sinus arrhythmia ST \\T \ T wave abnormality, consider inferior ischemia Abnormal ECG inferior st depression New since previous tracing             Doug Sou, MD 04/04/13 1512

## 2013-04-04 NOTE — ED Provider Notes (Signed)
CSN: [id number]     Arrival date & time 04/04/13  6213 History   First MD Initiated Contact with Patient 04/04/13 [phone]     Chief Complaint  Patient presents with  . Arm Pain   (Consider location/radiation/quality/duration/timing/severity/associated sxs/prior Treatment) HPI  Kristy Butler is a 52 y.o. female with past medical history significant for tobacco use disorder, anxiety and depression complaining of worsening left arm, left neck, left chest pain at 7 AM this morning associated with shortness of breath and nausea. Patient also reports a headache with photophobia. Patient had a similar pain for 7 days. She was seen and had a negative cardiac and upper extremity DVT workup 2 days ago. She's been taking no pain control medication at home. She also had an episode of diarrhea with no hematochezia or melena this a.m. Patient denies fever, cough, vomiting, diaphoresis, exacerbation with exertion, history of DVT or PE, recent immobilizations, swelling. Patient states that she feels like she is having a heart attack and thinks that she is going to die.   Past Medical History  Diagnosis Date  . Depression   . GERD (gastroesophageal reflux disease)   . Urinary incontinence    History reviewed. No pertinent past surgical history. No family history on file. History  Substance Use Topics  . Smoking status: Current Every Day Smoker -- 0.15 packs/day    Types: Cigarettes  . Smokeless tobacco: Not on file  . Alcohol Use: No   OB History   Grav Para Term Preterm Abortions TAB SAB Ect Mult Living                 Review of Systems  10 systems reviewed and found to be negative, except as noted in the HPI   Allergies  Sulfa antibiotics  Home Medications   Current Outpatient Rx  Name  Route  Sig  Dispense  Refill  . buPROPion (WELLBUTRIN SR) 150 MG 12 hr tablet   Oral   Take 150 mg by mouth 2 (two) times daily.         Marland Kitchen LORazepam (ATIVAN) 0.5 MG tablet   Oral   Take 0.25 mg by  mouth at bedtime.         . meloxicam (MOBIC) 7.5 MG tablet   Oral   Take 7.5-15 mg by mouth daily as needed for pain.         Marland Kitchen oxybutynin (DITROPAN-XL) 10 MG 24 hr tablet   Oral   Take 10 mg by mouth 2 (two) times daily.         Marland Kitchen PARoxetine (PAXIL) 20 MG tablet   Oral   Take 20 mg by mouth every morning.          BP 130/74  Pulse 68  Temp(Src) 98.1 F (36.7 C) (Oral)  Resp 18  SpO2 99% Physical Exam  Nursing note and vitals reviewed. Constitutional: She is oriented to person, place, and time. She appears well-developed and well-nourished. No distress.  HENT:  Head: Normocephalic.  Mouth/Throat: Oropharynx is clear and moist.  Eyes: Conjunctivae and EOM are normal. Pupils are equal, round, and reactive to light.  Neck: Normal range of motion. Neck supple.  No midline tenderness to palpation or step-offs appreciated. Patient has full range of motion without pain.   Cardiovascular: Normal rate, regular rhythm and intact distal pulses.   Pulmonary/Chest: Effort normal and breath sounds normal. No stridor. No respiratory distress. She has no wheezes. She has no rales. She exhibits tenderness.  Exquisite tenderness to palpation of the left anterior chest and between the shoulder blades.  Abdominal: Soft. Bowel sounds are normal. She exhibits no distension and no mass. There is no tenderness. There is no rebound and no guarding.  Musculoskeletal: Normal range of motion. She exhibits no edema and no tenderness.  Neurological: She is alert and oriented to person, place, and time.  Follows commands, Goal oriented speech, Strength is 5 out of 5x4 extremities, patient ambulates with a coordinated in nonantalgic gait. Sensation is grossly intact.   Psychiatric:  Agititated    ED Course  Procedures (including critical care time) Labs Review Labs Reviewed  BASIC METABOLIC PANEL - Abnormal; Notable for the following:    Potassium 3.3 (*)    Glucose, Bld 110 (*)    All  other components within normal limits  CBC  LIPASE, BLOOD  POCT I-STAT TROPONIN I   Imaging Review Dg Chest 2 View  04/04/2013   CLINICAL DATA:  Left arm and left-sided chest pain.  EXAM: CHEST  2 VIEW  COMPARISON:  A chest x-ray 04/01/2013.  FINDINGS: Lung volumes are normal. No consolidative airspace disease. No pleural effusions. No pneumothorax. No pulmonary nodule or mass noted. Pulmonary vasculature and the cardiomediastinal silhouette are within normal limits.  IMPRESSION: 1.  No radiographic evidence of acute cardiopulmonary disease.   Electronically Signed   By: Trudie Reed M.D.   On: 04/04/2013 09:59    EKG Interpretation     Ventricular Rate:  79 PR Interval:  144 QRS Duration: 88 QT Interval:  358 QTC Calculation: 410 R Axis:   83 Text Interpretation:  Normal sinus rhythm with sinus arrhythmia ST \\T \ T wave abnormality, consider inferior ischemia Abnormal ECG inferior st depression New since previous tracing            MDM   1. EKG abnormalities   2. Chest pain   3. Head ache      Filed Vitals:   04/04/13 0836 04/04/13 0858 04/04/13 1012  BP: 156/84  130/74  Pulse: 85  68  Temp:  98.1 F (36.7 C)   TempSrc:  Oral   Resp: 22  18  SpO2: 98%  99%     Kristy Butler is a 52 y.o. female with acutely worsening left arm, intrascapular, neck pain, headache, left-sided chest pain onset at 7 AM this morning. Patient was seen for similar several days ago and had negative cardiac and upper extremity DVT workup. EKG shows new ST depressions in the inferior leads. Patient's troponin is negative, chest x-ray shows no abnormalities and blood work is otherwise unremarkable. Patient will need admission for chest pain with EKG changes. She will be an unassigned admission to [facility], Dr. Sunnie Nielsen.   Note: Portions of this report may have been transcribed using voice recognition software. Every effort was made to ensure accuracy; however, inadvertent  computerized transcription errors may be present      Wynetta Emery, PA-C 04/04/13 1044

## 2013-04-04 NOTE — Telephone Encounter (Signed)
Ok refill??  Pt NTBS  Letter sent

## 2013-04-04 NOTE — ED Notes (Signed)
Patient transported to X-ray 

## 2013-04-04 NOTE — H&P (Signed)
[facility] History and Physical  Kristy Butler ZOX:[id number] DOB: December 23, 1960 DOA: 04/04/2013  Referring physician: PA PCP: Burnadette Pop  Specialists: none  Chief Complaint: Chest pain.   HPI: Kristy Butler is a 52 y.o. female with no significant PMH presents complaining of chest pain, grabbing pain, middle chest, radiate to neck and arm. Tingling in her arm. No nausea , or vomiting. Pain is not worse with food. She doesn't smoke or drinks alcohol. Pain is not related with exertion. She is very active.   Review of Systems: negative except per HPI.   Past Medical History  Diagnosis Date  . Depression   . GERD (gastroesophageal reflux disease)   . Urinary incontinence    History reviewed. No pertinent past surgical history. Social History:  reports that she has been smoking Cigarettes.  She has been smoking about 0.15 packs per day. She does not have any smokeless tobacco history on file. She reports that she does not drink alcohol or use illicit drugs.   Allergies  Allergen Reactions  . Sulfa Antibiotics Other (See Comments)    Reaction unknown    Family History: non contributory.   Prior to Admission medications   Medication Sig Start Date End Date Taking? Authorizing Provider  meloxicam (MOBIC) 7.5 MG tablet Take 7.5-15 mg by mouth daily as needed for pain.   Yes Historical Provider, MD  oxybutynin (DITROPAN-XL) 10 MG 24 hr tablet Take 10 mg by mouth 2 (two) times daily.   Yes Historical Provider, MD  PARoxetine (PAXIL) 20 MG tablet Take 20 mg by mouth every morning.   Yes Historical Provider, MD  buPROPion (WELLBUTRIN SR) 150 MG 12 hr tablet Take 1 tablet (150 mg total) by mouth 2 (two) times daily. 04/04/13   Patriciaann Clan Dixon, PA-C  LORazepam (ATIVAN) 0.5 MG tablet TAKE 1 TABLET 3 TIMES A DAY AS NEEDED FOR ANXIETY 04/04/13   Dorena Bodo, PA-C   Physical Exam: Filed Vitals:   04/04/13 1202  BP: 126/73  Pulse: 61  Temp: 97.6 F (36.4 C)  Resp: 16    General : no distress.  CVS: S 1, S 2 RRR Lungs: CTA Abdomen: SOft, NT, ND, NR Extremities; no edema.  Neurological: Non focal.   Labs on Admission:  Basic Metabolic Panel:  Recent Labs Lab 04/01/13 1317 04/04/13 0844  NA 139 143  K 3.6 3.3*  CL 105 105  CO2 29 27  GLUCOSE 93 110*  BUN 19 14  CREATININE 1.02 0.71  CALCIUM 8.9 9.2   Liver Function Tests: No results found for this basename: AST, ALT, ALKPHOS, BILITOT, PROT, ALBUMIN,  in the last 168 hours  Recent Labs Lab 04/04/13 0844  LIPASE 22   No results found for this basename: AMMONIA,  in the last 168 hours CBC:  Recent Labs Lab 04/01/13 1317 04/04/13 0844  WBC 9.9 5.6  NEUTROABS 6.5  --   HGB 12.3 13.6  HCT 35.7* 40.1  MCV 95.7 95.9  PLT 191 207   Cardiac Enzymes:  Recent Labs Lab 04/01/13 1317 04/01/13 1558  TROPONINI <0.30 <0.30    BNP (last 3 results) No results found for this basename: PROBNP,  in the last 8760 hours CBG: No results found for this basename: GLUCAP,  in the last 168 hours  Radiological Exams on Admission: Dg Chest 2 View  04/04/2013   CLINICAL DATA:  Left arm and left-sided chest pain.  EXAM: CHEST  2 VIEW  COMPARISON:  A chest x-ray 04/01/2013.  FINDINGS: Lung volumes are normal. No consolidative airspace disease. No pleural effusions. No pneumothorax. No pulmonary nodule or mass noted. Pulmonary vasculature and the cardiomediastinal silhouette are within normal limits.  IMPRESSION: 1.  No radiographic evidence of acute cardiopulmonary disease.   Electronically Signed   By: Trudie Reed M.D.   On: 04/04/2013 09:59    EKG: Independently reviewed. T wave inversion in inferior lead.   Assessment/Plan Active Problems:   * No active hospital problems. *   1. Chest pain: Atypical but with T wave depression in inferior lead. Admit to rule out ACS. Check UDS. Stop meloxican, start protonix.  2. Hypokalemia; Replete K level.    Code Status: Full Code.  Family  Communication: care discussed with patient.  Disposition Plan: admit under observation  Time spent: 65 minutes.   Langley Ingalls [facility] Pager [phone]  If 7PM-7AM, please contact night-coverage [email or web address] Password [email or web address] 04/04/2013, 12:41 PM

## 2013-04-04 NOTE — ED Notes (Signed)
Pt was just tx here for L arm pain with cardiac and dvt work-up.  States L arm pain increasing and radiates to L ear and between both shoulder blades.  Also c/o twinges in chest.

## 2013-04-05 DIAGNOSIS — I519 Heart disease, unspecified: Secondary | ICD-10-CM

## 2013-04-05 LAB — BASIC METABOLIC PANEL
BUN: 12 mg/dL (ref 6–23)
CO2: 26 mEq/L (ref 19–32)
Calcium: 8.4 mg/dL (ref 8.4–10.5)
Chloride: 111 mEq/L (ref 96–112)
Creatinine, Ser: 0.73 mg/dL (ref 0.50–1.10)
GFR calc Af Amer: >90 mL/min (ref >90)
GFR calc non Af Amer: >90 mL/min (ref >90)
Glucose, Bld: 97 mg/dL (ref 70–99)
Potassium: 3.9 mEq/L (ref 3.5–5.1)
Sodium: 143 mEq/L (ref 135–145)

## 2013-04-05 LAB — CBC
HCT: 34 % — ABNORMAL LOW (ref 36.0–46.0)
Hemoglobin: 11.4 g/dL — ABNORMAL LOW (ref 12.0–15.0)
MCH: 32.5 pg (ref 26.0–34.0)
MCHC: 33.5 g/dL (ref 30.0–36.0)
MCV: 96.9 fL (ref 78.0–100.0)
Platelets: 194 10*3/uL (ref 150–400)
RBC: 3.51 MIL/uL — ABNORMAL LOW (ref 3.87–5.11)
RDW: 13.8 % (ref 11.5–15.5)
WBC: 5.6 10*3/uL (ref 4.0–10.5)

## 2013-04-05 LAB — TROPONIN I: Troponin I: <0.3 ng/mL (ref <0.30)

## 2013-04-05 MED ORDER — HYDROCODONE-ACETAMINOPHEN 5-325 MG PO TABS: 1.0000 | ORAL_TABLET | Freq: Four times a day (QID) | ORAL | Status: DC | PRN

## 2013-04-05 MED ORDER — PANTOPRAZOLE SODIUM 20 MG PO TBEC: 20.0000 mg | DELAYED_RELEASE_TABLET | Freq: Every day | ORAL | Status: DC

## 2013-04-05 NOTE — Progress Notes (Signed)
Nutrition Brief Note  Patient identified on the Malnutrition Screening Tool (MST) Report for 14 lb weight loss. This weight loss occurred over 3 months ago and weight has been stable for the past 3 months.  Wt Readings from Last 15 Encounters:  04/05/13 137 lb 8 oz (62.37 kg)  04/01/13 135 lb (61.236 kg)  01/14/13 132 lb (59.875 kg)    Body mass index is 22.2 kg/(m^2). Patient meets criteria for normal weight based on current BMI.   Current diet order is heart healthy, patient is consuming approximately 75% of meals at this time. Labs and medications reviewed.   No nutrition interventions warranted at this time. If nutrition issues arise, please consult RD.   Joaquin Courts, RD, LDN, CNSC Pager [phone] After Hours Pager [phone]

## 2013-04-05 NOTE — Discharge Summary (Signed)
Physician Discharge Summary  [name]:[id number] DOB: 12-24-1960 DOA: 04/04/2013  PCP: Burnadette Pop  Admit date: 04/04/2013 Discharge date: 04/05/2013  Time spent: 30 minutes  Recommendations for Outpatient Follow-up:  1. Out patient stress tess, need to be arrange by PCP.   Discharge Diagnoses:  Abnormal EKG Chest pain, GI vs Muscle skeletal.  Hypokalemia  Discharge Condition: Stable  Diet recommendation: Heart Healthy  Filed Weights   04/04/13 1202 04/05/13 0500  Weight: 60.737 kg (133 lb 14.4 oz) 62.37 kg (137 lb 8 oz)    History of present illness:  Kristy Butler is a 52 y.o. female with no significant PMH presents complaining of chest pain, grabbing pain, middle chest, radiate to neck and arm. Tingling in her arm. No nausea , or vomiting. Pain is not worse with food. She doesn't smoke or drinks alcohol. Pain is not related with exertion. She is very active.    Hospital Course:  1-Chest pain: atypical. Cardiac enzymes times 3 negative. Could be muscle skeletal or GI in origin. I have stop meloxican. Will discharge on protonix. Due to T wave inversion on EKG need to follow up with cardiologist. If ECHO normal plan to discharge today.   Procedures:  ECHO pending  Consultations:  none  Discharge Exam: Filed Vitals:   04/05/13 0429  BP: 119/75  Pulse: 75  Temp: 97.8 F (36.6 C)  Resp: 18    General: no distress.  Cardiovascular: S 1, S 2 RRR Respiratory: CTA  Discharge Instructions  Discharge Orders   Future Orders Complete By Expires   Diet - low sodium heart healthy  As directed    Increase activity slowly  As directed        Medication List    STOP taking these medications       meloxicam 7.5 MG tablet  Commonly known as:  MOBIC      TAKE these medications       buPROPion 150 MG 12 hr tablet  Commonly known as:  WELLBUTRIN SR  Take 1 tablet (150 mg total) by mouth 2 (two) times daily.     HYDROcodone-acetaminophen 5-325  MG per tablet  Commonly known as:  NORCO/VICODIN  Take 1-2 tablets by mouth every 6 (six) hours as needed.     LORazepam 0.5 MG tablet  Commonly known as:  ATIVAN  TAKE 1 TABLET 3 TIMES A DAY AS NEEDED FOR ANXIETY     oxybutynin 10 MG 24 hr tablet  Commonly known as:  DITROPAN-XL  Take 10 mg by mouth 2 (two) times daily.     pantoprazole 20 MG tablet  Commonly known as:  PROTONIX  Take 1 tablet (20 mg total) by mouth daily.     PARoxetine 20 MG tablet  Commonly known as:  PAXIL  Take 20 mg by mouth every morning.       Allergies  Allergen Reactions  . Sulfa Antibiotics Other (See Comments)    Reaction unknown       Follow-up Information   Follow up with Burnadette Pop In 1 week. (you need referral to cardiologist)    Specialty:  Internal Medicine   Contact information:   [address] [place] [phone]        The results of significant diagnostics from this hospitalization (including imaging, microbiology, ancillary and laboratory) are listed below for reference.    Significant Diagnostic Studies: Dg Chest 2 View  04/04/2013   CLINICAL DATA:  Left arm and left-sided chest pain.  EXAM: CHEST  2 VIEW  COMPARISON:  A chest x-ray 04/01/2013.  FINDINGS: Lung volumes are normal. No consolidative airspace disease. No pleural effusions. No pneumothorax. No pulmonary nodule or mass noted. Pulmonary vasculature and the cardiomediastinal silhouette are within normal limits.  IMPRESSION: 1.  No radiographic evidence of acute cardiopulmonary disease.   Electronically Signed   By: Trudie Reed M.D.   On: 04/04/2013 09:59   Dg Chest 2 View  04/01/2013   CLINICAL DATA:  Left arm and neck pain, acute, nausea, smoker  EXAM: CHEST  2 VIEW  COMPARISON:  None.  FINDINGS: Normal heart size, mediastinal contours, and pulmonary vascularity.  Lungs clear.  No pleural effusion or pneumothorax.  Bilateral nipple shadows.  Question slight hyperinflation.  IMPRESSION: No acute  abnormalities.   Electronically Signed   By: Ulyses Southward M.D.   On: 04/01/2013 13:31    Microbiology: No results found for this or any previous visit (from the past 240 hour(s)).   Labs: Basic Metabolic Panel:  Recent Labs Lab 04/01/13 1317 04/04/13 0844 04/05/13 0500  NA 139 143 143  K 3.6 3.3* 3.9  CL 105 105 111  CO2 29 27 26   GLUCOSE 93 110* 97  BUN 19 14 12   CREATININE 1.02 0.71 0.73  CALCIUM 8.9 9.2 8.4   Liver Function Tests: No results found for this basename: AST, ALT, ALKPHOS, BILITOT, PROT, ALBUMIN,  in the last 168 hours  Recent Labs Lab 04/04/13 0844  LIPASE 22   No results found for this basename: AMMONIA,  in the last 168 hours CBC:  Recent Labs Lab 04/01/13 1317 04/04/13 0844 04/05/13 0500  WBC 9.9 5.6 5.6  NEUTROABS 6.5  --   --   HGB 12.3 13.6 11.4*  HCT 35.7* 40.1 34.0*  MCV 95.7 95.9 96.9  PLT 191 207 194   Cardiac Enzymes:  Recent Labs Lab 04/01/13 1317 04/01/13 1558 04/04/13 1030 04/04/13 1810 04/05/13 0035  TROPONINI <0.30 <0.30 <0.30 <0.30 <0.30   BNP: BNP (last 3 results) No results found for this basename: PROBNP,  in the last 8760 hours CBG: No results found for this basename: GLUCAP,  in the last 168 hours     Signed:  Japji Kok  [facility] 04/05/2013, 10:42 AM

## 2013-04-08 ENCOUNTER — Ambulatory Visit (INDEPENDENT_AMBULATORY_CARE_PROVIDER_SITE_OTHER): Payer: BC Managed Care – PPO | Admitting: Family Medicine

## 2013-04-08 ENCOUNTER — Encounter: Payer: Self-pay | Admitting: Family Medicine

## 2013-04-08 VITALS — BP 100/60 | HR 80 | Temp 97.6°F | Resp 18 | Wt 131.0 lb

## 2013-04-08 DIAGNOSIS — R079 Chest pain, unspecified: Secondary | ICD-10-CM

## 2013-04-08 MED ORDER — PREDNISONE 20 MG PO TABS: ORAL_TABLET | ORAL | Status: DC

## 2013-04-08 NOTE — Progress Notes (Signed)
Subjective:    Patient ID: Kristy Butler, female    DOB: 01/11/1961, 52 y.o.   MRN: [id number]  HPI Patient recently was admitted with atypical chest pain due to an abnormal EKG.  CArdiac enzymes were negative and an echo was also negative.  She was discharged on protonix but the pain has not improved.  She complains of non-exertional chest pain in the left chest that is reporducible today with palpation at the costochondral junction.  SHe denies pleurisy, cough, or hemoptysis.  SHe denies GERD.  She has been having more anxitey recently but states that the pain does not feel like her previous anxiety attacks.  SHe is also complaining of heaviness in her left arm and tingling in her left arm. >pmh Current Outpatient Prescriptions on File Prior to Visit  Medication Sig Dispense Refill  . buPROPion (WELLBUTRIN SR) 150 MG 12 hr tablet Take 1 tablet (150 mg total) by mouth 2 (two) times daily.  60 tablet  0  . LORazepam (ATIVAN) 0.5 MG tablet TAKE 1 TABLET 3 TIMES A DAY AS NEEDED FOR ANXIETY  90 tablet  0  . oxybutynin (DITROPAN-XL) 10 MG 24 hr tablet Take 10 mg by mouth 2 (two) times daily.      . pantoprazole (PROTONIX) 20 MG tablet Take 1 tablet (20 mg total) by mouth daily.  30 tablet  0  . PARoxetine (PAXIL) 20 MG tablet Take 20 mg by mouth every morning.       No current facility-administered medications on file prior to visit.   Allergies  Allergen Reactions  . Sulfa Antibiotics Other (See Comments)    Reaction unknown   History   Social History  . Marital Status: Divorced    Spouse Name: N/A    Number of Children: N/A  . Years of Education: N/A   Occupational History  . Not on file.   Social History Main Topics  . Smoking status: Current Every Day Smoker -- 0.50 packs/day for 20 years    Types: Cigarettes  . Smokeless tobacco: Never Used  . Alcohol Use: No  . Drug Use: No  . Sexual Activity: Yes   Other Topics Concern  . Not on file   Social History Narrative  . No  narrative on file      Review of Systems  All other systems reviewed and are negative.       Objective:   Physical Exam  Vitals reviewed. Constitutional: She is oriented to person, place, and time.  Cardiovascular: Normal rate, regular rhythm, normal heart sounds and intact distal pulses.  Exam reveals no gallop and no friction rub.   No murmur heard. Pulmonary/Chest: Effort normal and breath sounds normal. No respiratory distress. She has no wheezes. She has no rales. She exhibits tenderness.  Abdominal: Soft. Bowel sounds are normal. She exhibits no distension. There is no tenderness. There is no rebound.  Neurological: She is alert and oriented to person, place, and time. She has normal reflexes. She displays normal reflexes. No cranial nerve deficit. She exhibits normal muscle tone. Coordination normal.   Pain is reproducible with palpation at the costochondral junction.       Assessment & Plan:  1. Chest pain I feel the patient may be having costochondritis or anxiety attacks.  Begin empirically treating costochondritis with prednisone taper pack.  Consider an MRI of C-spine if left arm radiculopathy persists.  If chest pain does not improve, schedule a stress test of the heart with cardiology and  consider changing paxil to cymbalta for better treatment of her anxiety. - predniSONE (DELTASONE) 20 MG tablet; 3 tabs poqday 1-2, 2 tabs poqday 3-4, 1 tab poqday 5-6  Dispense: 12 tablet; Refill: 0

## 2013-04-11 ENCOUNTER — Inpatient Hospital Stay: Payer: BC Managed Care – PPO | Admitting: Family Medicine

## 2013-04-30 ENCOUNTER — Encounter: Payer: Self-pay | Admitting: Family Medicine

## 2013-04-30 ENCOUNTER — Ambulatory Visit (INDEPENDENT_AMBULATORY_CARE_PROVIDER_SITE_OTHER): Payer: BC Managed Care – PPO | Admitting: Family Medicine

## 2013-04-30 VITALS — BP 120/70 | HR 74 | Temp 98.9°F | Resp 16 | Ht 66.0 in | Wt 135.0 lb

## 2013-04-30 DIAGNOSIS — R079 Chest pain, unspecified: Secondary | ICD-10-CM

## 2013-04-30 DIAGNOSIS — J329 Chronic sinusitis, unspecified: Secondary | ICD-10-CM

## 2013-04-30 MED ORDER — FLUTICASONE PROPIONATE 50 MCG/ACT NA SUSP: 2.0000 | Freq: Every day | NASAL | Status: DC

## 2013-04-30 NOTE — Progress Notes (Signed)
Subjective:    Patient ID: Kristy Butler, female    DOB: [birth year], 52 y.o.   MRN: [id number]  HPI 04/08/13 Patient recently was admitted with atypical chest pain due to an abnormal EKG.  CArdiac enzymes were negative and an echo was also negative.  She was discharged on protonix but the pain has not improved.  She complains of non-exertional chest pain in the left chest that is reporducible today with palpation at the costochondral junction.  SHe denies pleurisy, cough, or hemoptysis.  SHe denies GERD.  She has been having more anxitey recently but states that the pain does not feel like her previous anxiety attacks.  SHe is also complaining of heaviness in her left arm and tingling in her left arm.At that time, my plan was: 1. Chest pain I feel the patient may be having costochondritis or anxiety attacks.  Begin empirically treating costochondritis with prednisone taper pack.  Consider an MRI of C-spine if left arm radiculopathy persists.  If chest pain does not improve, schedule a stress test of the heart with cardiology and consider changing paxil to cymbalta for better treatment of her anxiety. - predniSONE (DELTASONE) 20 MG tablet; 3 tabs poqday 1-2, 2 tabs poqday 3-4, 1 tab poqday 5-6  Dispense: 12 tablet; Refill: 0  Patient's chest pain improved dramatically on prednisone. However after discontinuing prednisone she again developed left-sided chest pain that is reproducible on palpation of the left costochondral junction. She also complains of rhinorrhea and nasal congestion. She denies any GERD, indigestion, melena, or abdominal pain. She denies any shortness of breath or anxiety Past Medical History  Diagnosis Date  . Depression   . GERD (gastroesophageal reflux disease)   . Urinary incontinence   . Shortness of breath     "just related to my heart issues right now" (04/04/2013)  . Migraines     "last one was ~ 5 yr ago" (04/04/2013)  . Arthritis     "top of my neck, spine, right  knee" (04/04/2013)  . Anxiety     Current Outpatient Prescriptions on File Prior to Visit  Medication Sig Dispense Refill  . buPROPion (WELLBUTRIN SR) 150 MG 12 hr tablet Take 1 tablet (150 mg total) by mouth 2 (two) times daily.  60 tablet  0  . LORazepam (ATIVAN) 0.5 MG tablet TAKE 1 TABLET 3 TIMES A DAY AS NEEDED FOR ANXIETY  90 tablet  0  . oxybutynin (DITROPAN-XL) 10 MG 24 hr tablet Take 10 mg by mouth 2 (two) times daily.      . pantoprazole (PROTONIX) 20 MG tablet Take 1 tablet (20 mg total) by mouth daily.  30 tablet  0  . PARoxetine (PAXIL) 20 MG tablet Take 20 mg by mouth every morning.       No current facility-administered medications on file prior to visit.   Allergies  Allergen Reactions  . Sulfa Antibiotics Other (See Comments)    Reaction unknown   History   Social History  . Marital Status: Divorced    Spouse Name: N/A    Number of Children: N/A  . Years of Education: N/A   Occupational History  . Not on file.   Social History Main Topics  . Smoking status: Current Every Day Smoker -- 0.50 packs/day for 20 years    Types: Cigarettes  . Smokeless tobacco: Never Used  . Alcohol Use: No  . Drug Use: No  . Sexual Activity: Yes   Other Topics Concern  . Not on  file   Social History Narrative  . No narrative on file      Review of Systems  All other systems reviewed and are negative.       Objective:   Physical Exam  Vitals reviewed. Constitutional: She is oriented to person, place, and time.  Cardiovascular: Normal rate, regular rhythm, normal heart sounds and intact distal pulses.  Exam reveals no gallop and no friction rub.   No murmur heard. Pulmonary/Chest: Effort normal and breath sounds normal. No respiratory distress. She has no wheezes. She has no rales. She exhibits tenderness.  Abdominal: Soft. Bowel sounds are normal. She exhibits no distension. There is no tenderness. There is no rebound.  Neurological: She is alert and oriented to  person, place, and time. She has normal reflexes. No cranial nerve deficit. She exhibits normal muscle tone. Coordination normal.   Pain is reproducible with palpation at the costochondral junction.       Assessment & Plan:  1. Sinusitis nasal I believe this is most likely allergies. Begin Flonase. - fluticasone (FLONASE) 50 MCG/ACT nasal spray; Place 2 sprays into both nostrils daily.  Dispense: 16 g; Refill: 6  2. Chest pain I still feel the patient likely has costochondritis. Begin Celebrex 200 mg by mouth daily. The patient has a listed allergy to sulfur drugs. However she is never tried sulfa drugs by mouth. It was a rash after using topical antibiotics. Therefore Celebrex hopefully the safer on her stomach. She is to discontinue the medicines immediately if any signs of allergic reaction developed. Recheck in 2 weeks sooner if worse.

## 2013-05-16 ENCOUNTER — Other Ambulatory Visit: Payer: Self-pay | Admitting: Physician Assistant

## 2013-05-16 NOTE — Telephone Encounter (Signed)
Last Rf 10/30 #90  Last OV 11/3 and 11/25  OK refill?

## 2013-05-17 ENCOUNTER — Other Ambulatory Visit: Payer: Self-pay | Admitting: Physician Assistant

## 2013-05-17 NOTE — Telephone Encounter (Signed)
She can have 90 but needs to try to cut back use to once a day.

## 2013-05-19 NOTE — Telephone Encounter (Signed)
?   OK to Refill  

## 2013-05-20 NOTE — Telephone Encounter (Signed)
Due for office visit. Have her schedule office visit. Let me know she needs a few of her lorazepam to hold her until then or if she has some remaining to use until OV

## 2013-05-21 ENCOUNTER — Other Ambulatory Visit: Payer: Self-pay | Admitting: Physician Assistant

## 2013-05-21 NOTE — Telephone Encounter (Signed)
Med was refilled 05/16/13 by Dr Tanya Nones

## 2013-05-22 ENCOUNTER — Other Ambulatory Visit: Payer: Self-pay | Admitting: Physician Assistant

## 2013-05-22 NOTE — Telephone Encounter (Signed)
Medication refilled per protocol. 

## 2013-05-22 NOTE — Telephone Encounter (Signed)
This was refilled last week

## 2013-05-27 ENCOUNTER — Telehealth: Payer: Self-pay | Admitting: Family Medicine

## 2013-05-27 ENCOUNTER — Other Ambulatory Visit: Payer: Self-pay | Admitting: Physician Assistant

## 2013-05-27 NOTE — Telephone Encounter (Signed)
Pt is wanting to know why her Lorazepam has not got refilled Call back [phone] Pharmacy [facility] [address]

## 2013-05-28 MED ORDER — LORAZEPAM 0.5 MG PO TABS: ORAL_TABLET | ORAL | Status: DC

## 2013-05-28 NOTE — Telephone Encounter (Signed)
Medication called in to pharm and pt aware it has been called in and that she needs to try and cut back to one a day per vm left on phone number provided by pt.

## 2013-06-29 ENCOUNTER — Other Ambulatory Visit: Payer: Self-pay | Admitting: Family Medicine

## 2013-07-04 ENCOUNTER — Other Ambulatory Visit: Payer: Self-pay | Admitting: Family Medicine

## 2013-07-10 ENCOUNTER — Other Ambulatory Visit: Payer: Self-pay | Admitting: Family Medicine

## 2013-07-11 NOTE — Telephone Encounter (Signed)
?   OK to Refill  

## 2013-07-11 NOTE — Telephone Encounter (Signed)
ok 

## 2013-08-26 ENCOUNTER — Other Ambulatory Visit: Payer: Self-pay | Admitting: Family Medicine

## 2013-08-27 NOTE — Telephone Encounter (Signed)
Last Rf 07/10/13  #90.  Last OV 04/30/13  OK refill?

## 2013-08-27 NOTE — Telephone Encounter (Signed)
rx faxed to pharmacy

## 2013-09-05 ENCOUNTER — Other Ambulatory Visit: Payer: Self-pay | Admitting: Physician Assistant

## 2013-09-05 NOTE — Telephone Encounter (Signed)
Refill appropriate and filled per protocol. 

## 2013-10-04 ENCOUNTER — Other Ambulatory Visit: Payer: Self-pay | Admitting: Family Medicine

## 2013-10-04 NOTE — Telephone Encounter (Signed)
Medication called to pharmacy. 

## 2013-10-04 NOTE — Telephone Encounter (Signed)
ok 

## 2013-10-04 NOTE — Telephone Encounter (Signed)
?   OK to Refill  

## 2013-11-09 ENCOUNTER — Other Ambulatory Visit: Payer: Self-pay | Admitting: Physician Assistant

## 2013-11-11 NOTE — Telephone Encounter (Signed)
rx called in for one month.

## 2013-11-11 NOTE — Telephone Encounter (Signed)
ok 

## 2013-11-11 NOTE — Telephone Encounter (Signed)
Ok to refill??  Last office visit 04/30/2013.  Last refill 10/04/2013.

## 2013-12-12 ENCOUNTER — Other Ambulatory Visit: Payer: Self-pay | Admitting: Family Medicine

## 2013-12-12 NOTE — Telephone Encounter (Signed)
?   OK to Refill LOV 04/30/2013 and she owes money

## 2013-12-13 NOTE — Telephone Encounter (Signed)
Medication called to pharmacy. 

## 2013-12-13 NOTE — Telephone Encounter (Signed)
Ok to refill 

## 2013-12-27 ENCOUNTER — Other Ambulatory Visit: Payer: Self-pay | Admitting: Family Medicine

## 2013-12-27 ENCOUNTER — Other Ambulatory Visit: Payer: Self-pay | Admitting: *Deleted

## 2013-12-27 MED ORDER — BUPROPION HCL ER (SR) 150 MG PO TB12: ORAL_TABLET | ORAL | Status: DC

## 2013-12-27 MED ORDER — PAROXETINE HCL 20 MG PO TABS: ORAL_TABLET | ORAL | Status: DC

## 2013-12-27 MED ORDER — PANTOPRAZOLE SODIUM 20 MG PO TBEC: 20.0000 mg | DELAYED_RELEASE_TABLET | Freq: Every day | ORAL | Status: DC

## 2013-12-27 MED ORDER — OXYBUTYNIN CHLORIDE ER 10 MG PO TB24: ORAL_TABLET | ORAL | Status: DC

## 2013-12-27 MED ORDER — FLUTICASONE PROPIONATE 50 MCG/ACT NA SUSP: 2.0000 | Freq: Every day | NASAL | Status: DC

## 2013-12-27 NOTE — Telephone Encounter (Signed)
Medication filled x1 with no refills.   Patient has been discharged from practice. No further refills will be given.

## 2014-01-15 ENCOUNTER — Telehealth: Payer: Self-pay | Admitting: Family Medicine

## 2014-01-15 NOTE — Telephone Encounter (Signed)
[phone]   PT is needing to speak to you about her medication because she no longer has insurance.

## 2014-01-16 NOTE — Telephone Encounter (Signed)
Contacted pt and she had stated that they have no insurance and wanted to know if there was something cheaper they can can get out of pocket, [facility] called [facility] , pt states is still too much I informed pt that she can try calling [facility] and see if it would be much cheaper for them. She is going to call me back and let me know.

## 2014-02-05 ENCOUNTER — Other Ambulatory Visit: Payer: Self-pay | Admitting: Family Medicine

## 2014-02-05 NOTE — Telephone Encounter (Signed)
Ok to refill??  Last office visit 04/30/2013.  Last refill 12/13/2013.

## 2014-02-06 NOTE — Telephone Encounter (Signed)
ok 

## 2014-02-06 NOTE — Telephone Encounter (Signed)
Medication called to pharmacy. 

## 2014-02-16 ENCOUNTER — Other Ambulatory Visit: Payer: Self-pay | Admitting: Family Medicine

## 2014-04-01 ENCOUNTER — Encounter: Payer: Self-pay | Admitting: Family Medicine

## 2014-04-01 ENCOUNTER — Other Ambulatory Visit: Payer: Self-pay | Admitting: Family Medicine

## 2014-04-01 NOTE — Telephone Encounter (Signed)
Ntbs, ok with refill until appt.  Has not been seen by me since 11/14

## 2014-04-01 NOTE — Telephone Encounter (Signed)
Med called to pharm and will send letter for OV

## 2014-04-01 NOTE — Telephone Encounter (Signed)
?   OK to Refill  

## 2014-04-19 ENCOUNTER — Other Ambulatory Visit: Payer: Self-pay | Admitting: Family Medicine

## 2014-04-28 ENCOUNTER — Other Ambulatory Visit: Payer: Self-pay | Admitting: Family Medicine

## 2014-05-06 ENCOUNTER — Ambulatory Visit: Payer: BC Managed Care – PPO | Admitting: Family Medicine

## 2014-05-12 ENCOUNTER — Other Ambulatory Visit: Payer: Self-pay | Admitting: Family Medicine

## 2014-05-12 NOTE — Telephone Encounter (Signed)
?   OK to Refill  

## 2014-05-13 NOTE — Telephone Encounter (Signed)
Medication called to pharmacy. 

## 2014-05-13 NOTE — Telephone Encounter (Signed)
ok 

## 2014-06-09 ENCOUNTER — Ambulatory Visit (INDEPENDENT_AMBULATORY_CARE_PROVIDER_SITE_OTHER): Payer: 59 | Admitting: Family Medicine

## 2014-06-09 ENCOUNTER — Encounter: Payer: Self-pay | Admitting: Family Medicine

## 2014-06-09 VITALS — BP 100/64 | HR 96 | Temp 98.6°F | Resp 18 | Ht 66.0 in | Wt 135.0 lb

## 2014-06-09 DIAGNOSIS — F418 Other specified anxiety disorders: Secondary | ICD-10-CM

## 2014-06-09 DIAGNOSIS — J019 Acute sinusitis, unspecified: Secondary | ICD-10-CM

## 2014-06-09 MED ORDER — AMOXICILLIN-POT CLAVULANATE 875-125 MG PO TABS
1.0000 | ORAL_TABLET | Freq: Two times a day (BID) | ORAL | Status: DC
Start: 2014-06-09 — End: 2014-08-22

## 2014-06-09 MED ORDER — VENLAFAXINE HCL ER 75 MG PO CP24: ORAL_CAPSULE | ORAL | Status: DC

## 2014-06-09 NOTE — Progress Notes (Signed)
Subjective:    Patient ID: Kristy Butler, female    DOB: [birth year], 54 y.o.   MRN: [id number]  HPI  Patient is here today for follow-up of her depression and anxiety. She is currently taking Wellbutrin SR 150 mg 1 tablet by mouth twice a day. She is also augmenting that with Paxil 20 mg by mouth daily. Paxil does not seem to be working as well anymore. She is having a panic attack almost on a daily basis. She takes Ativan 0.5 mg by mouth daily at bedtime to help her sleep or for panic attacks. She is under tremendous stress trying to help care for her sister-in-law who is currently dying. 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 All the stress and pressure seems to be getting to her. In addition to the panic attack she reports anhedonia, depression, apathy, and lack of energy.  In addition she is also had a sinus infection for a week. She complains of bilateral ear pain, right frontal sinus pain and pressure, a constant daily headache, postnasal drip, and a cough productive of green and bloody sputum. Past Medical History  Diagnosis Date  . Depression   . GERD (gastroesophageal reflux disease)   . Urinary incontinence   . Shortness of breath     "just related to my heart issues right now" (04/04/2013)  . Migraines     "last one was ~ 5 yr ago" (04/04/2013)  . Arthritis     "top of my neck, spine, right knee" (04/04/2013)  . Anxiety    Past Surgical History  Procedure Laterality Date  . Tubal ligation  ~ 1993  . Great toe arthrodesis, interphalangeal joint Right 2010    "replaced joint in my foot" (04/04/2013)  . Turbinate resection Bilateral ~ 1990  . Joint replacement     Current Outpatient Prescriptions on File Prior to Visit  Medication Sig Dispense Refill  . buPROPion (WELLBUTRIN SR) 150 MG 12 hr tablet TAKE ONE TABLET BY MOUTH TWICE DAILY 60 tablet 1  . LORazepam (ATIVAN) 0.5 MG tablet TAKE 1 TABLET BY MOUTH 3 TIMES A DAY AS NEEDED 90 tablet  0  . oxybutynin (DITROPAN-XL) 10 MG 24 hr tablet TAKE 2 TABLETS BY MOUTH EVERY DAY 60 tablet 0  . PARoxetine (PAXIL) 20 MG tablet TAKE ONE TABLET BY MOUTH  DAILY 30 tablet 0  . fluticasone (FLONASE) 50 MCG/ACT nasal spray Place 2 sprays into both nostrils daily. (Patient not taking: Reported on 06/09/2014) 16 g 0   No current facility-administered medications on file prior to visit.   Allergies  Allergen Reactions  . Sulfa Antibiotics Other (See Comments)    Reaction unknown   History   Social History  . Marital Status: Divorced    Spouse Name: N/A    Number of Children: N/A  . Years of Education: N/A   Occupational History  . Not on file.   Social History Main Topics  . Smoking status: Current Every Day Smoker -- 0.50 packs/day for 20 years    Types: Cigarettes  . Smokeless tobacco: Never Used  . Alcohol Use: No  . Drug Use: No  . Sexual Activity: Yes   Other Topics Concern  . Not on file   Social History Narrative     Review of Systems  All other systems reviewed and are negative.      Objective:   Physical Exam  Constitutional: She appears well-developed and well-nourished.  HENT:  Right Ear: Tympanic membrane, external ear and ear canal normal.  Left Ear: Tympanic membrane, external ear and ear canal normal.  Nose: Mucosal edema and rhinorrhea present. Right sinus exhibits maxillary sinus tenderness and frontal sinus tenderness. Left sinus exhibits frontal sinus tenderness.  Mouth/Throat: Oropharynx is clear and moist.  Eyes: Conjunctivae are normal. Pupils are equal, round, and reactive to light.  Neck: Neck supple.  Cardiovascular: Normal rate, regular rhythm and normal heart sounds.   Pulmonary/Chest: Effort normal and breath sounds normal. No respiratory distress. She has no wheezes. She has no rales.  Lymphadenopathy:    She has no cervical adenopathy.  Vitals reviewed.         Assessment & Plan:  Depression with anxiety - Plan: venlafaxine XR  (EFFEXOR XR) 75 MG 24 hr capsule  Acute rhinosinusitis - Plan: amoxicillin-clavulanate (AUGMENTIN) 875-125 MG per tablet  I will treat her sinusitis with Augmentin 875 mg by mouth twice a day for 10 days. I will have the patient discontinue Paxil. Meanwhile start Effexor XR 75 mg by mouth every morning and increase to 150 mg by mouth every morning and one week. She can continue to use Ativan on an as-needed basis although I cautioned the patient about the risk of habituation.  Recheck in one month.

## 2014-06-26 ENCOUNTER — Other Ambulatory Visit: Payer: Self-pay | Admitting: Family Medicine

## 2014-06-26 NOTE — Telephone Encounter (Signed)
Ok to refill??  Last office visit 06/09/2014.  Last refill 05/13/2014.

## 2014-06-26 NOTE — Telephone Encounter (Signed)
ok 

## 2014-06-26 NOTE — Telephone Encounter (Signed)
Medication called to pharmacy. 

## 2014-07-25 ENCOUNTER — Other Ambulatory Visit: Payer: Self-pay | Admitting: Family Medicine

## 2014-07-25 NOTE — Telephone Encounter (Signed)
Medication refilled per protocol. 

## 2014-07-30 ENCOUNTER — Telehealth: Payer: Self-pay | Admitting: Family Medicine

## 2014-07-30 NOTE — Telephone Encounter (Signed)
PA submitted through [email or web address]  

## 2014-07-31 NOTE — Telephone Encounter (Signed)
Per [facility] no PA needed for this medication - form faxed to pharm making them aware of this

## 2014-08-12 ENCOUNTER — Other Ambulatory Visit: Payer: Self-pay | Admitting: Family Medicine

## 2014-08-12 NOTE — Telephone Encounter (Signed)
LRF 06/26/14 #90  LOV 06/19/14  OK refill?

## 2014-08-12 NOTE — Telephone Encounter (Signed)
ok 

## 2014-08-12 NOTE — Telephone Encounter (Signed)
rx called in

## 2014-08-22 ENCOUNTER — Ambulatory Visit (INDEPENDENT_AMBULATORY_CARE_PROVIDER_SITE_OTHER): Payer: 59 | Admitting: Family Medicine

## 2014-08-22 ENCOUNTER — Encounter: Payer: Self-pay | Admitting: Family Medicine

## 2014-08-22 VITALS — BP 120/88 | HR 78 | Temp 98.4°F | Resp 18 | Ht 66.0 in | Wt 138.0 lb

## 2014-08-22 DIAGNOSIS — L237 Allergic contact dermatitis due to plants, except food: Secondary | ICD-10-CM

## 2014-08-22 DIAGNOSIS — D485 Neoplasm of uncertain behavior of skin: Secondary | ICD-10-CM | POA: Diagnosis not present

## 2014-08-22 MED ORDER — PREDNISONE 20 MG PO TABS: ORAL_TABLET | ORAL | Status: DC

## 2014-08-22 MED ORDER — TRETINOIN 0.025 % EX GEL: Freq: Every day | CUTANEOUS | Status: DC

## 2014-08-22 NOTE — Progress Notes (Signed)
Subjective:    Patient ID: Kristy Butler, female    DOB: 1960-06-20, 54 y.o.   MRN: [id number]  HPI Patient has a lesion on her anterior left thigh that seems to be changing. It is 1 cm in diameter. It is an erythematous flat papule with white scale. It seems to be growing and changing colors. She would like it biopsied. She also has poison ivy all over her legs particularly around her ankles and on her abdomen and her forearms. She is tried cortisone cream without relief and she is requesting prednisone. She is also requesting Retin-A that she can use topically on her face to lighten "age spots".   Past Medical History  Diagnosis Date  . Depression   . GERD (gastroesophageal reflux disease)   . Urinary incontinence   . Shortness of breath     "just related to my heart issues right now" (04/04/2013)  . Migraines     "last one was ~ 5 yr ago" (04/04/2013)  . Arthritis     "top of my neck, spine, right knee" (04/04/2013)  . Anxiety    Past Surgical History  Procedure Laterality Date  . Tubal ligation  ~ 1993  . Great toe arthrodesis, interphalangeal joint Right 2010    "replaced joint in my foot" (04/04/2013)  . Turbinate resection Bilateral ~ 1990  . Joint replacement     Current Outpatient Prescriptions on File Prior to Visit  Medication Sig Dispense Refill  . buPROPion (WELLBUTRIN SR) 150 MG 12 hr tablet TAKE 1 TABLET TWICE A DAY 60 tablet 2  . LORazepam (ATIVAN) 0.5 MG tablet TAKE 1 TABLET 3 TIMES A DAY AS NEEDED FOR ANXIETY 90 tablet 0  . venlafaxine XR (EFFEXOR XR) 75 MG 24 hr capsule Take 1 pill in the Am for 1 week and then increase to 2 pills in the AM and quit paxil. 60 capsule 5  . fluticasone (FLONASE) 50 MCG/ACT nasal spray Place 2 sprays into both nostrils daily. (Patient not taking: Reported on 06/09/2014) 16 g 0  . oxybutynin (DITROPAN-XL) 10 MG 24 hr tablet TAKE 2 TABLETS BY MOUTH EVERY DAY (Patient not taking: Reported on 08/22/2014) 60 tablet 0   No current  facility-administered medications on file prior to visit.   Allergies  Allergen Reactions  . Sulfa Antibiotics Other (See Comments)    Reaction unknown   History   Social History  . Marital Status: Divorced    Spouse Name: N/A  . Number of Children: N/A  . Years of Education: N/A   Occupational History  . Not on file.   Social History Main Topics  . Smoking status: Current Every Day Smoker -- 0.50 packs/day for 20 years    Types: Cigarettes  . Smokeless tobacco: Never Used  . Alcohol Use: No  . Drug Use: No  . Sexual Activity: Yes   Other Topics Concern  . Not on file   Social History Narrative      Review of Systems  All other systems reviewed and are negative.      Objective:   Physical Exam  Cardiovascular: Normal rate and regular rhythm.   Pulmonary/Chest: Effort normal and breath sounds normal.  Skin: Rash noted. There is erythema.  Vitals reviewed.         Assessment & Plan:  Neoplasm of uncertain behavior of skin - Plan: Pathology  Poison ivy dermatitis - Plan: predniSONE (DELTASONE) 20 MG tablet  I suspect that this is an irritated seborrheic  keratosis versus actinic keratosis. Using sterile technique, I anesthetized the lesion was 0.1% lidocaine with epinephrine. A shave biopsy was performed. The lesion was sent to pathology and labeled container. Hemostasis was achieved with Drysol and a Band-Aid. I will treat her contact dermatitis with prednisone taper pack. Also refilled the patient's Retin-A

## 2014-08-26 LAB — PATHOLOGY

## 2014-10-08 ENCOUNTER — Other Ambulatory Visit: Payer: Self-pay | Admitting: Family Medicine

## 2014-10-08 NOTE — Telephone Encounter (Signed)
?   OK to Refill  

## 2014-10-09 NOTE — Telephone Encounter (Signed)
ok 

## 2014-10-09 NOTE — Telephone Encounter (Signed)
rx called in

## 2014-11-28 ENCOUNTER — Other Ambulatory Visit: Payer: Self-pay | Admitting: Family Medicine

## 2014-11-28 NOTE — Telephone Encounter (Signed)
?   OK to Refill  

## 2014-11-28 NOTE — Telephone Encounter (Signed)
Medication refilled per protocol. 

## 2014-11-28 NOTE — Telephone Encounter (Signed)
ok 

## 2014-12-11 ENCOUNTER — Other Ambulatory Visit: Payer: Self-pay | Admitting: Family Medicine

## 2015-01-02 ENCOUNTER — Other Ambulatory Visit: Payer: Self-pay | Admitting: Family Medicine

## 2015-01-02 ENCOUNTER — Encounter: Payer: Self-pay | Admitting: Family Medicine

## 2015-01-02 MED ORDER — VENLAFAXINE HCL ER 150 MG PO CP24: 150.0000 mg | ORAL_CAPSULE | Freq: Every day | ORAL | Status: DC

## 2015-01-02 NOTE — Telephone Encounter (Signed)
Medication refill for one time only.  Patient needs to be seen.  Letter sent for patient to call and schedule 

## 2015-01-05 ENCOUNTER — Telehealth: Payer: Self-pay | Admitting: Family Medicine

## 2015-01-05 NOTE — Telephone Encounter (Signed)
Patient calling to say that when her Effexor was called into pharmacy her dosage had changed, would like to know was this supposed to happen? [phone]

## 2015-01-05 NOTE — Telephone Encounter (Signed)
Pt told Effexor refilled at 150 mg maintenence dose.  Provider did want her to follow up in 6 weeks.  Appt made

## 2015-01-14 ENCOUNTER — Telehealth: Payer: Self-pay | Admitting: Family Medicine

## 2015-01-14 NOTE — Telephone Encounter (Signed)
PA submitted for Bupropion SR 150 mg thru [facility] case [id number]  [id number].

## 2015-01-16 ENCOUNTER — Telehealth: Payer: Self-pay | Admitting: Family Medicine

## 2015-01-16 NOTE — Telephone Encounter (Signed)
Pt calling today because almost out of her Bupropion.  I called [facility].  They do have request.  Said requests can take from 1-15 days but could see hers was in the final stages.

## 2015-01-16 NOTE — Telephone Encounter (Signed)
Still working on PA for Bupropion.  Accidentally closed first PA message.  CMM [id number]   [id number]

## 2015-01-16 NOTE — Telephone Encounter (Signed)
rec'd notice from [place].  PA NOT needed for this medication  Pt called and notice faxed to pharmacy

## 2015-01-20 ENCOUNTER — Ambulatory Visit (INDEPENDENT_AMBULATORY_CARE_PROVIDER_SITE_OTHER): Payer: 59 | Admitting: Family Medicine

## 2015-01-20 ENCOUNTER — Encounter: Payer: Self-pay | Admitting: Family Medicine

## 2015-01-20 VITALS — BP 106/64 | HR 86 | Temp 98.8°F | Resp 16 | Ht 66.0 in | Wt 139.0 lb

## 2015-01-20 DIAGNOSIS — G43009 Migraine without aura, not intractable, without status migrainosus: Secondary | ICD-10-CM | POA: Diagnosis not present

## 2015-01-20 DIAGNOSIS — Z13220 Encounter for screening for lipoid disorders: Secondary | ICD-10-CM

## 2015-01-20 DIAGNOSIS — Z1211 Encounter for screening for malignant neoplasm of colon: Secondary | ICD-10-CM | POA: Diagnosis not present

## 2015-01-20 DIAGNOSIS — F418 Other specified anxiety disorders: Secondary | ICD-10-CM

## 2015-01-20 MED ORDER — RIZATRIPTAN BENZOATE 10 MG PO TBDP: 10.0000 mg | ORAL_TABLET | ORAL | Status: DC | PRN

## 2015-01-20 MED ORDER — FLUTICASONE PROPIONATE 50 MCG/ACT NA SUSP: 2.0000 | Freq: Every day | NASAL | Status: DC

## 2015-01-20 MED ORDER — OXYBUTYNIN CHLORIDE ER 10 MG PO TB24: 10.0000 mg | ORAL_TABLET | Freq: Every day | ORAL | Status: DC

## 2015-01-20 MED ORDER — OXYBUTYNIN CHLORIDE ER 10 MG PO TB24: ORAL_TABLET | ORAL | Status: DC

## 2015-01-20 NOTE — Progress Notes (Signed)
Subjective:    Patient ID: Kristy Butler, female    DOB: 08-Oct-1960, 54 y.o.   MRN: [id number]  HPI  06/2014 Patient is here today for follow-up of her depression and anxiety. She is currently taking Wellbutrin SR 150 mg 1 tablet by mouth twice a day. She is also augmenting that with Paxil 20 mg by mouth daily. Paxil does not seem to be working as well anymore. She is having a panic attack almost on a daily basis. She takes Ativan 0.5 mg by mouth daily at bedtime to help her sleep or for panic attacks. She is under tremendous stress trying to help care for her sister-in-law who is currently dying. 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 All the stress and pressure seems to be getting to her. In addition to the panic attack she reports anhedonia, depression, apathy, and lack of energy.  In addition she is also had a sinus infection for a week. She complains of bilateral ear pain, right frontal sinus pain and pressure, a constant daily headache, postnasal drip, and a cough productive of green and bloody sputum.  At that time, my plan was: I will treat her sinusitis with Augmentin 875 mg by mouth twice a day for 10 days. I will have the patient discontinue Paxil. Meanwhile start Effexor XR 75 mg by mouth every morning and increase to 150 mg by mouth every morning and one week. She can continue to use Ativan on an as-needed basis although I cautioned the patient about the risk of habituation.  Recheck in one month.  01/20/15  patient is back today for recheck. She states that her depression and anxiety are doing much better on the combination of Effexor and Wellbutrin. Anxiety is much improved. She is sleeping better. Her depression has improved. She does report increased frequency of migraine headaches and she is requesting that I refill a prescription of Maxalt. She is also requesting a refill for Flonase which she takes for allergies as well as  Oxybutynin.   She isn't  oxybutynin more for stress incontinence however. She reports leakage with coughing, laughing, a running. I explained to the patient the difference but she states that the medicine really does help her and she would like to continue it. I certainly don't see any contraindications to her using this arm fine refilling it. She is overdue for a mammogram as well as a colonoscopy. She refuses a colonoscopy. She states that she will schedule her own mammogram. She is also due for screening lab work Past Medical History  Diagnosis Date  . Depression   . GERD (gastroesophageal reflux disease)   . Urinary incontinence   . Shortness of breath     "just related to my heart issues right now" (04/04/2013)  . Migraines     "last one was ~ 5 yr ago" (04/04/2013)  . Arthritis     "top of my neck, spine, right knee" (04/04/2013)  . Anxiety    Past Surgical History  Procedure Laterality Date  . Tubal ligation  ~ 1993  . Great toe arthrodesis, interphalangeal joint Right 2010    "replaced joint in my foot" (04/04/2013)  . Turbinate resection Bilateral ~ 1990  . Joint replacement     Current Outpatient Prescriptions on File Prior to Visit  Medication Sig Dispense Refill  . buPROPion (WELLBUTRIN SR) 150 MG 12 hr tablet TAKE 1 TABLET TWICE A DAY 60 tablet 2  . LORazepam (ATIVAN)  0.5 MG tablet TAKE 1 TABLET 3 TIMES A DAY PNR 90 tablet 0  . tretinoin (RETIN-A) 0.025 % gel Apply topically at bedtime. 45 g 0  . venlafaxine XR (EFFEXOR-XR) 150 MG 24 hr capsule Take 1 capsule (150 mg total) by mouth daily with breakfast. 30 capsule 0   No current facility-administered medications on file prior to visit.   Allergies  Allergen Reactions  . Sulfa Antibiotics Other (See Comments)    Reaction unknown   Social History   Social History  . Marital Status: Divorced    Spouse Name: N/A  . Number of Children: N/A  . Years of Education: N/A   Occupational History  . Not on file.   Social History Main Topics    . Smoking status: Current Every Day Smoker -- 0.50 packs/day for 20 years    Types: Cigarettes  . Smokeless tobacco: Never Used  . Alcohol Use: No  . Drug Use: No  . Sexual Activity: Yes   Other Topics Concern  . Not on file   Social History Narrative     Review of Systems  All other systems reviewed and are negative.      Objective:   Physical Exam  Constitutional: She appears well-developed and well-nourished.  HENT:  Right Ear: Tympanic membrane, external ear and ear canal normal.  Left Ear: Tympanic membrane, external ear and ear canal normal.  Mouth/Throat: Oropharynx is clear and moist.  Eyes: Conjunctivae are normal. Pupils are equal, round, and reactive to light.  Neck: Neck supple.  Cardiovascular: Normal rate, regular rhythm and normal heart sounds.   Pulmonary/Chest: Effort normal and breath sounds normal. No respiratory distress. She has no wheezes. She has no rales.  Lymphadenopathy:    She has no cervical adenopathy.  Vitals reviewed.         Assessment & Plan:  Migraine without aura and without status migrainosus, not intractable - Plan: rizatriptan (MAXALT-MLT) 10 MG disintegrating tablet  Colon cancer screening - Plan: Fecal occult blood, imunochemical, Fecal occult blood, imunochemical, Fecal occult blood, imunochemical  Depression with anxiety  Screening cholesterol level - Plan: CBC with Differential/Platelet, COMPLETE METABOLIC PANEL WITH GFR, Lipid panel   patient can use Maxalt 10 mg by mouth daily when necessary migraine. Continue Effexor and Wellbutrin for depression and anxiety as this seems to be working well. Patient concerned use Ativan on an as-needed basis for anxiety. She refuses a colonoscopy but she will consent to fecal occult blood cards 3 and therefore I will obtain these. I have asked the patient to return fasting for a CBC, CMP, fasting lipid panel. I continue to recommend smoking cessation. I will also refill the patient's   Extended release oxybutynin 10 mg by mouthdaily since it seems to be helping with her stress incontinence even though the medication is not indicated for that.

## 2015-01-27 ENCOUNTER — Other Ambulatory Visit: Payer: Self-pay | Admitting: Family Medicine

## 2015-02-06 ENCOUNTER — Other Ambulatory Visit: Payer: Self-pay | Admitting: Family Medicine

## 2015-02-06 MED ORDER — LORAZEPAM 0.5 MG PO TABS: 0.5000 mg | ORAL_TABLET | Freq: Three times a day (TID) | ORAL | Status: DC | PRN

## 2015-02-06 NOTE — Telephone Encounter (Signed)
LRF 11/28/14 #90  LOV 01/20/15  OK refill?

## 2015-02-06 NOTE — Telephone Encounter (Signed)
Medication refilled per protocol. 

## 2015-02-06 NOTE — Telephone Encounter (Signed)
ok 

## 2015-03-20 ENCOUNTER — Other Ambulatory Visit: Payer: Self-pay | Admitting: Family Medicine

## 2015-03-20 NOTE — Telephone Encounter (Signed)
Medication called to pharmacy. 

## 2015-03-20 NOTE — Telephone Encounter (Signed)
Ok to refill??  Last office visit 01/20/2015.  Last refill 02/06/2015.

## 2015-03-20 NOTE — Telephone Encounter (Signed)
ok 

## 2015-05-17 ENCOUNTER — Other Ambulatory Visit: Payer: Self-pay | Admitting: Family Medicine

## 2015-06-09 ENCOUNTER — Other Ambulatory Visit: Payer: Self-pay | Admitting: Family Medicine

## 2015-06-09 MED ORDER — LORAZEPAM 0.5 MG PO TABS: 0.5000 mg | ORAL_TABLET | Freq: Three times a day (TID) | ORAL | Status: DC | PRN

## 2015-06-09 NOTE — Telephone Encounter (Signed)
ok 

## 2015-06-09 NOTE — Telephone Encounter (Signed)
LRF 03/20/15 #90  LOV 01/20/15  OK refill?

## 2015-06-09 NOTE — Telephone Encounter (Signed)
RX called in .

## 2015-06-12 ENCOUNTER — Other Ambulatory Visit: Payer: Self-pay | Admitting: Family Medicine

## 2015-06-12 MED ORDER — FLUTICASONE PROPIONATE 50 MCG/ACT NA SUSP: 2.0000 | Freq: Every day | NASAL | Status: DC

## 2015-07-20 ENCOUNTER — Other Ambulatory Visit: Payer: Self-pay | Admitting: Family Medicine

## 2015-07-21 NOTE — Telephone Encounter (Signed)
Ok to refill??  Last office visit 01/17/2015.  Last refill 06/09/2015.

## 2015-07-21 NOTE — Telephone Encounter (Signed)
ok 

## 2015-07-21 NOTE — Telephone Encounter (Signed)
Medication called to pharmacy. 

## 2015-08-19 ENCOUNTER — Other Ambulatory Visit: Payer: Self-pay | Admitting: Family Medicine

## 2015-08-19 ENCOUNTER — Encounter: Payer: Self-pay | Admitting: Family Medicine

## 2015-08-19 NOTE — Telephone Encounter (Signed)
Medication refill for one time only.  Patient needs to be seen.  Letter sent for patient to call and schedule 

## 2015-08-22 ENCOUNTER — Other Ambulatory Visit: Payer: Self-pay | Admitting: Family Medicine

## 2015-08-24 NOTE — Telephone Encounter (Signed)
ok 

## 2015-08-24 NOTE — Telephone Encounter (Signed)
Ok to refill??  Last office visit 01/20/2015.  Last refill 07/21/2015.

## 2015-08-25 NOTE — Telephone Encounter (Signed)
rx called in

## 2015-09-13 ENCOUNTER — Other Ambulatory Visit: Payer: Self-pay | Admitting: Family Medicine

## 2015-09-29 ENCOUNTER — Other Ambulatory Visit: Payer: Self-pay | Admitting: Family Medicine

## 2015-09-29 ENCOUNTER — Encounter: Payer: Self-pay | Admitting: Family Medicine

## 2015-09-29 ENCOUNTER — Other Ambulatory Visit: Payer: BLUE CROSS/BLUE SHIELD

## 2015-09-29 DIAGNOSIS — F329 Major depressive disorder, single episode, unspecified: Secondary | ICD-10-CM | POA: Insufficient documentation

## 2015-09-29 LAB — CBC WITH DIFFERENTIAL/PLATELET
Basophils Absolute: 50 cells/uL (ref 0–200)
Basophils Relative: 1 %
Eosinophils Absolute: 150 cells/uL (ref 15–500)
Eosinophils Relative: 3 %
HCT: 37.6 % (ref 35.0–45.0)
Hemoglobin: 12.4 g/dL (ref 12.0–15.0)
Lymphocytes Relative: 37 %
Lymphs Abs: 1850 cells/uL (ref 850–3900)
MCH: 31.7 pg (ref 27.0–33.0)
MCHC: 33 g/dL (ref 32.0–36.0)
MCV: 96.2 fL (ref 80.0–100.0)
MPV: 9.1 fL (ref 7.5–12.5)
Monocytes Absolute: 350 cells/uL (ref 200–950)
Monocytes Relative: 7 %
Neutro Abs: 2600 cells/uL (ref 1500–7800)
Neutrophils Relative %: 52 %
Platelets: 251 10*3/uL (ref 140–400)
RBC: 3.91 MIL/uL (ref 3.80–5.10)
RDW: 14.1 % (ref 11.0–15.0)
WBC: 5 10*3/uL (ref 3.8–10.8)

## 2015-09-30 LAB — LIPID PANEL
Cholesterol: 125 mg/dL (ref 125–200)
HDL: 33 mg/dL — ABNORMAL LOW (ref >=46)
LDL Cholesterol: 80 mg/dL (ref <130)
Total CHOL/HDL Ratio: 3.8 Ratio (ref <=5.0)
Triglycerides: 60 mg/dL (ref <150)
VLDL: 12 mg/dL (ref <30)

## 2015-09-30 LAB — COMPLETE METABOLIC PANEL WITH GFR
ALT: 15 U/L (ref 6–29)
AST: 17 U/L (ref 10–35)
Albumin: 3.7 g/dL (ref 3.6–5.1)
Alkaline Phosphatase: 75 U/L (ref 33–130)
BUN: 9 mg/dL (ref 7–25)
CO2: 28 mmol/L (ref 20–31)
Calcium: 8.5 mg/dL — ABNORMAL LOW (ref 8.6–10.4)
Chloride: 110 mmol/L (ref 98–110)
Creat: 0.74 mg/dL (ref 0.50–1.05)
GFR, Est African American: >89 mL/min (ref >=60)
GFR, Est Non African American: >89 mL/min (ref >=60)
Glucose, Bld: 89 mg/dL (ref 70–99)
Potassium: 4.2 mmol/L (ref 3.5–5.3)
Sodium: 144 mmol/L (ref 135–146)
Total Bilirubin: 0.2 mg/dL (ref 0.2–1.2)
Total Protein: 5.8 g/dL — ABNORMAL LOW (ref 6.1–8.1)

## 2015-10-01 ENCOUNTER — Ambulatory Visit: Payer: 59 | Admitting: Family Medicine

## 2015-10-01 ENCOUNTER — Other Ambulatory Visit: Payer: Self-pay | Admitting: Family Medicine

## 2015-10-01 NOTE — Telephone Encounter (Signed)
no

## 2015-10-01 NOTE — Telephone Encounter (Signed)
Refill refused

## 2015-10-01 NOTE — Telephone Encounter (Signed)
Ok to refill??      - rescheduled appt for 10/05/15

## 2015-10-05 ENCOUNTER — Ambulatory Visit (INDEPENDENT_AMBULATORY_CARE_PROVIDER_SITE_OTHER): Payer: BLUE CROSS/BLUE SHIELD | Admitting: Family Medicine

## 2015-10-05 ENCOUNTER — Encounter: Payer: Self-pay | Admitting: Family Medicine

## 2015-10-05 VITALS — BP 122/80 | HR 80 | Temp 98.3°F | Resp 18 | Ht 66.0 in | Wt 133.0 lb

## 2015-10-05 DIAGNOSIS — F418 Other specified anxiety disorders: Secondary | ICD-10-CM

## 2015-10-05 DIAGNOSIS — R1011 Right upper quadrant pain: Secondary | ICD-10-CM

## 2015-10-05 MED ORDER — LORAZEPAM 0.5 MG PO TABS: 0.5000 mg | ORAL_TABLET | Freq: Three times a day (TID) | ORAL | Status: DC | PRN

## 2015-10-05 NOTE — Progress Notes (Signed)
Subjective:    Patient ID: Kristy Butler, female    DOB: April 19, 1961, 55 y.o.   MRN: [id number]  HPI  Patient is here today at my request. She continues to ask for refills on her lorazepam but has not been seen in more than 6 months. Therefore I asked her to come in today for follow-up. Her most recent lab work as listed below and is significant for low HDL cholesterol and slightly low calcium: Orders Only on 09/29/2015  Component Date Value Ref Range Status  . WBC 09/29/2015 5.0  3.8 - 10.8 K/uL Final  . RBC 09/29/2015 3.91  3.80 - 5.10 MIL/uL Final  . Hemoglobin 09/29/2015 12.4  12.0 - 15.0 g/dL Final  . HCT 09/29/2015 37.6  35.0 - 45.0 % Final  . MCV 09/29/2015 96.2  80.0 - 100.0 fL Final  . MCH 09/29/2015 31.7  27.0 - 33.0 pg Final  . MCHC 09/29/2015 33.0  32.0 - 36.0 g/dL Final  . RDW 09/29/2015 14.1  11.0 - 15.0 % Final  . Platelets 09/29/2015 251  140 - 400 K/uL Final  . MPV 09/29/2015 9.1  7.5 - 12.5 fL Final  . Neutro Abs 09/29/2015 2600  1500 - 7800 cells/uL Final  . Lymphs Abs 09/29/2015 1850  850 - 3900 cells/uL Final  . Monocytes Absolute 09/29/2015 350  200 - 950 cells/uL Final  . Eosinophils Absolute 09/29/2015 150  15 - 500 cells/uL Final  . Basophils Absolute 09/29/2015 50  0 - 200 cells/uL Final  . Neutrophils Relative % 09/29/2015 52   Final  . Lymphocytes Relative 09/29/2015 37   Final  . Monocytes Relative 09/29/2015 7   Final  . Eosinophils Relative 09/29/2015 3   Final  . Basophils Relative 09/29/2015 1   Final  . Smear Review 09/29/2015 Criteria for review not met   Final   ** Please note change in unit of measure and reference range(s). **  Orders Only on 09/29/2015  Component Date Value Ref Range Status  . Cholesterol 09/29/2015 125  125 - 200 mg/dL Final  . Triglycerides 09/29/2015 60  <150 mg/dL Final  . HDL 09/29/2015 33* >=46 mg/dL Final  . Total CHOL/HDL Ratio 09/29/2015 3.8  <=5.0 Ratio Final  . VLDL 09/29/2015 12  <30 mg/dL Final  . LDL  Cholesterol 09/29/2015 80  <130 mg/dL Final   Comment:   Total Cholesterol/HDL Ratio:CHD Risk                        Coronary Heart Disease Risk Table                                        Men       Women          1/2 Average Risk              3.4        3.3              Average Risk              5.0        4.4           2X Average Risk              9.6        7.1  3X Average Risk             23.4       11.0 Use the calculated Patient Ratio above and the CHD Risk table  to determine the patient's CHD Risk.   Orders Only on 09/29/2015  Component Date Value Ref Range Status  . Sodium 09/29/2015 144  135 - 146 mmol/L Final  . Potassium 09/29/2015 4.2  3.5 - 5.3 mmol/L Final  . Chloride 09/29/2015 110  98 - 110 mmol/L Final  . CO2 09/29/2015 28  20 - 31 mmol/L Final  . Glucose, Bld 09/29/2015 89  70 - 99 mg/dL Final  . BUN 09/29/2015 9  7 - 25 mg/dL Final  . Creat 09/29/2015 0.74  0.50 - 1.05 mg/dL Final  . Total Bilirubin 09/29/2015 0.2  0.2 - 1.2 mg/dL Final  . Alkaline Phosphatase 09/29/2015 75  33 - 130 U/L Final  . AST 09/29/2015 17  10 - 35 U/L Final  . ALT 09/29/2015 15  6 - 29 U/L Final  . Total Protein 09/29/2015 5.8* 6.1 - 8.1 g/dL Final  . Albumin 09/29/2015 3.7  3.6 - 5.1 g/dL Final  . Calcium 09/29/2015 8.5* 8.6 - 10.4 mg/dL Final  . GFR, Est African American 09/29/2015 >89  >=60 mL/min Final  . GFR, Est Non African American 09/29/2015 >89  >=60 mL/min Final   Comment:   The estimated GFR is a calculation valid for adults (>=55 years old) that uses the CKD-EPI algorithm to adjust for age and sex. It is   not to be used for children, pregnant women, hospitalized patients,    patients on dialysis, or with rapidly changing kidney function. According to the NKDEP, eGFR >89 is normal, 60-89 shows mild impairment, 30-59 shows moderate impairment, 15-29 shows severe impairment and <15 is ESRD.      Patient has been battling depression and anxiety ever since her  father was murdered. She states that the Effexor and Wellbutrin seemed to manage her depression well but she still requires lorazepam up to 3 times a day to help manage her anxiety and panic. 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 We discussed today possibly switching her from Effexor to trintellix with the hopes that maybe we can lessen her dependency on the lorazepam.  However, she is very hesitant to make any changes because she states that it is working well for her at the present time and she is afraid of making any changes will cause her to progress. 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 She still smokes and has no desire to quit smoking. Recently she had an episode of severe intense right upper quadrant abdominal pain that lasted almost an entire day. The pain came and went in waves and was located under her right breast. They gradually subsided and has not returned since. Was associated with nausea. She blames it on anxiety but it sounds like biliary colic. Past Medical History  Diagnosis Date  . Depression   . GERD (gastroesophageal reflux disease)   . Urinary incontinence   . Shortness of breath     "just related to my heart issues right now" (04/04/2013)  . Migraines     "last one was ~ 5 yr ago" (04/04/2013)  . Arthritis     "top of my neck,  spine, right knee" (04/04/2013)  . Anxiety    Past Surgical History  Procedure Laterality Date  . Tubal ligation  ~ 1993  . Great toe arthrodesis, interphalangeal joint Right 2010    "replaced joint in my foot" (04/04/2013)  . Turbinate resection Bilateral ~ 1990  . Joint replacement     Current Outpatient Prescriptions on File Prior to Visit  Medication Sig Dispense Refill  . buPROPion (WELLBUTRIN SR) 150 MG 12 hr tablet TAKE 1 TABLET BY MOUTH TWICE  DAILY 60 tablet 11  . fluticasone (FLONASE) 50 MCG/ACT nasal spray Place 2 sprays into both nostrils daily. 16 g 2  . oxybutynin (DITROPAN-XL) 10 MG 24 hr tablet TAKE 1 TABLETS BY MOUTH EVERY DAY 30 tablet 5  . rizatriptan (MAXALT-MLT) 10 MG disintegrating tablet Take 1 tablet (10 mg total) by mouth as needed for migraine. May repeat in 2 hours if needed 10 tablet 0  . tretinoin (RETIN-A) 0.025 % gel Apply topically at bedtime. 45 g 0  . venlafaxine XR (EFFEXOR-XR) 150 MG 24 hr capsule TAKE 1 CAPSULE BY MOUTH EVERY DAY WITH BREAKFAST 30 capsule 5   No current facility-administered medications on file prior to visit.   Allergies  Allergen Reactions  . Sulfa Antibiotics Other (See Comments)    Reaction unknown   Social History   Social History  . Marital Status: Divorced    Spouse Name: N/A  . Number of Children: N/A  . Years of Education: N/A   Occupational History  . Not on file.   Social History Main Topics  . Smoking status: Current Every Day Smoker -- 0.50 packs/day for 20 years    Types: Cigarettes  . Smokeless tobacco: Never Used  . Alcohol Use: No  . Drug Use: No  . Sexual Activity: Yes   Other Topics Concern  . Not on file   Social History Narrative     Review of Systems  All other systems reviewed and are negative.      Objective:   Physical Exam  Constitutional: She appears well-developed and well-nourished.  Neck: Neck supple. No thyromegaly present.  Cardiovascular: Normal rate, regular rhythm and normal heart sounds.   Pulmonary/Chest: Effort normal and breath sounds normal. No respiratory distress. She has no wheezes. She has no rales.  Abdominal: Soft. Bowel sounds are normal. She exhibits no distension. There is no tenderness. There is no rebound.  Lymphadenopathy:    She has no cervical adenopathy.  Psychiatric: She has a normal mood and affect. Her behavior is normal. Judgment and thought content normal.  Vitals reviewed.           Assessment & Plan:  Depression with anxiety  Right upper quadrant pain  At the present time, I will make no changes in her dose of bupropion or Effexor. She can continue to use Ativan as needed for anxiety. I gave the patient 90 tablets with 0 refills today. I recommended smoking cessation. Also recommended that she begin exercising aerobically with an ultimate goal 30 minutes of aerobic exercise 5 days a week to help raise her HDL cholesterol and also help manage her anxiety. Also discussed with her that I believe the pain she was having was biliary colic. I recommended obtaining a right upper quadrant ultrasound to evaluate further but she declined the present time and states that she doesn't want any testing unless the pain returns.

## 2015-10-17 ENCOUNTER — Encounter: Payer: Self-pay | Admitting: Family Medicine

## 2015-11-06 ENCOUNTER — Other Ambulatory Visit: Payer: Self-pay | Admitting: Family Medicine

## 2015-11-06 NOTE — Telephone Encounter (Signed)
Medication called to pharmacy. 

## 2015-11-06 NOTE — Telephone Encounter (Signed)
ok 

## 2015-11-06 NOTE — Telephone Encounter (Signed)
Ok to refill??  Last office visit/ refill 10/05/2015.

## 2015-11-13 ENCOUNTER — Telehealth: Payer: Self-pay | Admitting: Family Medicine

## 2015-11-13 NOTE — Telephone Encounter (Signed)
Patient calling to request bone density test if possible  [phone] (H)

## 2015-11-16 NOTE — Telephone Encounter (Signed)
Normally recommended at 84. I am ok with scheduling, but I am not sure if insurance will pay without justification.  Why does she want it early?

## 2015-11-25 NOTE — Telephone Encounter (Signed)
Patient aware of results and would like to call ins to see if it covers it and will call us back.  Pt would like to know if you could call her in some Melxoicam for her hip and ankle pain? She said that she took it a while back and it seemed to help a lot.

## 2015-11-26 MED ORDER — MELOXICAM 15 MG PO TABS: 15.0000 mg | ORAL_TABLET | Freq: Every day | ORAL | Status: DC

## 2015-11-26 NOTE — Telephone Encounter (Signed)
Medication called/sent to requested pharmacy  

## 2015-11-26 NOTE — Telephone Encounter (Signed)
Ok with mobic 15 mg poqday prn joint pain 30 (Rf=1)

## 2015-12-02 ENCOUNTER — Other Ambulatory Visit: Payer: Self-pay | Admitting: Family Medicine

## 2015-12-03 NOTE — Telephone Encounter (Signed)
Ok to refill??  Last office visit 10/05/2015.  Last refill 11/06/2015.

## 2015-12-03 NOTE — Telephone Encounter (Signed)
Medication called to pharmacy. 

## 2015-12-03 NOTE — Telephone Encounter (Signed)
Reviewed OV note 10/05/2015.  Approved # [id number] + 0.

## 2016-01-04 ENCOUNTER — Other Ambulatory Visit: Payer: Self-pay | Admitting: Physician Assistant

## 2016-01-05 NOTE — Telephone Encounter (Signed)
Ok to refill??  Last office visit 10/05/2015.  Last refill 12/03/2015.

## 2016-01-05 NOTE — Telephone Encounter (Signed)
ok 

## 2016-01-05 NOTE — Telephone Encounter (Signed)
LRF 12/03/15 #90.  LOV 10/05/15  OK refill?

## 2016-01-08 NOTE — Telephone Encounter (Signed)
Medication called to pharmacy. 

## 2016-01-20 ENCOUNTER — Telehealth: Payer: Self-pay | Admitting: Family Medicine

## 2016-01-20 ENCOUNTER — Encounter: Payer: Self-pay | Admitting: Physician Assistant

## 2016-01-20 ENCOUNTER — Ambulatory Visit (INDEPENDENT_AMBULATORY_CARE_PROVIDER_SITE_OTHER): Payer: BLUE CROSS/BLUE SHIELD | Admitting: Physician Assistant

## 2016-01-20 VITALS — BP 124/72 | HR 96 | Temp 98.6°F | Resp 16 | Ht 66.0 in | Wt 130.0 lb

## 2016-01-20 DIAGNOSIS — L239 Allergic contact dermatitis, unspecified cause: Secondary | ICD-10-CM

## 2016-01-20 DIAGNOSIS — L200 Besnier's prurigo: Secondary | ICD-10-CM | POA: Diagnosis not present

## 2016-01-20 DIAGNOSIS — L237 Allergic contact dermatitis due to plants, except food: Secondary | ICD-10-CM

## 2016-01-20 MED ORDER — METHYLPREDNISOLONE ACETATE 80 MG/ML IJ SUSP
80.0000 mg | Freq: Once | INTRAMUSCULAR | Status: AC
  Administered 2016-01-20: 80 mg via INTRAMUSCULAR

## 2016-01-20 NOTE — Progress Notes (Signed)
    Patient ID: Kristy Butler MRN: [id number], DOB: December 11, 1960, 55 y.o. Date of Encounter: 01/20/2016, 2:46 PM    Chief Complaint:  Chief Complaint  Patient presents with  . Poison Ivy    x 2 days     HPI: 55 y.o. year old white female presents with above.  She says that on Saturday 01/16/16 she was cutting some bushes. Started to develop itchy rash on Sunday on her right foot and right wrist. Since then it started spreading up her arms and neck and ears. Says even this morning it has continued to spread more and more and is extremely itchy-- so finally had to come in. Has one itchy area on her right torso.     Home Meds:   Outpatient Medications Prior to Visit  Medication Sig Dispense Refill  . buPROPion (WELLBUTRIN SR) 150 MG 12 hr tablet TAKE 1 TABLET BY MOUTH TWICE DAILY 60 tablet 11  . LORazepam (ATIVAN) 0.5 MG tablet TAKE 1 TABLET BY MOUTH THREE TIMES DAILY 90 tablet 0  . meloxicam (MOBIC) 15 MG tablet Take 1 tablet (15 mg total) by mouth daily. 30 tablet 1  . oxybutynin (DITROPAN-XL) 10 MG 24 hr tablet TAKE 1 TABLETS BY MOUTH EVERY DAY 30 tablet 5  . rizatriptan (MAXALT-MLT) 10 MG disintegrating tablet Take 1 tablet (10 mg total) by mouth as needed for migraine. May repeat in 2 hours if needed 10 tablet 0  . tretinoin (RETIN-A) 0.025 % gel Apply topically at bedtime. 45 g 0  . fluticasone (FLONASE) 50 MCG/ACT nasal spray Place 2 sprays into both nostrils daily. (Patient not taking: Reported on 01/20/2016) 16 g 2  . venlafaxine XR (EFFEXOR-XR) 150 MG 24 hr capsule TAKE 1 CAPSULE BY MOUTH EVERY DAY WITH BREAKFAST (Patient not taking: Reported on 01/20/2016) 30 capsule 5   No facility-administered medications prior to visit.     Allergies:  Allergies  Allergen Reactions  . Sulfa Antibiotics Other (See Comments)    Reaction unknown      Review of Systems: See HPI for pertinent ROS. All other ROS negative.    Physical Exam: Blood pressure 124/72, pulse 96, temperature  98.6 F (37 C), temperature source Oral, resp. rate 16, height 5\' 6"  (1.676 m), weight 130 lb (59 kg), SpO2 97 %., Body mass index is 20.98 kg/m. General:  WNWD WF. Appears in no acute distress. Neck: Supple. No thyromegaly. No lymphadenopathy. Lungs: Clear bilaterally to auscultation without wheezes, rales, or rhonchi. Breathing is unlabored. Heart: Regular rhythm. No murmurs, rubs, or gallops. Msk:  Strength and tone normal for age. Extremities/Skin: She has several papulovesicular lesions on her dorsum of her right foot. Some splotchy papules on her lower legs. Left forearm with papules and linear distribution. Forearm was scattered papules. Splotchy urticarial type rash up the side of her neck. Splotchy urticarial type rash on her torso. Neuro: Alert and oriented X 3. Moves all extremities spontaneously. Gait is normal. CNII-XII grossly in tact. Psych:  Responds to questions appropriately with a normal affect.     ASSESSMENT AND PLAN:  55 y.o. year old female with   1. Allergic dermatitis Give Depo-Medrol 80 mg IM now. She can also take Benadryl oral as directed. Follow-up if rash does not resolve or if it reoccurs. 2. Poison ivy - methylPREDNISolone acetate (DEPO-MEDROL) injection 80 mg; Inject 1 mL (80 mg total) into the muscle once.   [address] [place], [place], [facility] 01/20/2016 2:46 PM

## 2016-01-20 NOTE — Telephone Encounter (Signed)
Has gotten into some poison.  Has it bad on both arms, worse on right.  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  Asking if we can please call her in some prednisone.?

## 2016-01-21 ENCOUNTER — Other Ambulatory Visit: Payer: Self-pay | Admitting: Family Medicine

## 2016-01-21 MED ORDER — PREDNISONE 20 MG PO TABS: ORAL_TABLET | ORAL | 0 refills | Status: DC

## 2016-01-21 NOTE — Telephone Encounter (Signed)
I escribed prednsione to [facility]

## 2016-01-21 NOTE — Telephone Encounter (Signed)
Refill appropriate and filled per protocol. 

## 2016-01-27 ENCOUNTER — Other Ambulatory Visit: Payer: Self-pay | Admitting: Family Medicine

## 2016-01-27 MED ORDER — FLUTICASONE PROPIONATE 50 MCG/ACT NA SUSP: 2.0000 | Freq: Every day | NASAL | 2 refills | Status: DC

## 2016-02-09 ENCOUNTER — Other Ambulatory Visit: Payer: Self-pay | Admitting: Family Medicine

## 2016-02-09 NOTE — Telephone Encounter (Signed)
I am okay with refilling the patient's Ativan but I want her to make a concerted effort to try to use less of this medication. 3 tablets a day for a prolonged period of time is excessive. Try to wean herself back is much as possible to avoid dependency

## 2016-02-09 NOTE — Telephone Encounter (Signed)
Ok to refill??  Last office visit 01/20/2016.  Last refill 01/08/2016.

## 2016-02-11 NOTE — Telephone Encounter (Signed)
Medication called to pharmacy. 

## 2016-02-11 NOTE — Telephone Encounter (Signed)
I advised patient of what Dr. Dennard Schaumann noted and let her know that this medication will be called in today.

## 2016-02-18 ENCOUNTER — Other Ambulatory Visit: Payer: Self-pay | Admitting: Family Medicine

## 2016-03-15 ENCOUNTER — Other Ambulatory Visit: Payer: Self-pay | Admitting: Family Medicine

## 2016-03-15 DIAGNOSIS — G43009 Migraine without aura, not intractable, without status migrainosus: Secondary | ICD-10-CM

## 2016-03-17 ENCOUNTER — Other Ambulatory Visit: Payer: Self-pay | Admitting: Family Medicine

## 2016-03-17 NOTE — Telephone Encounter (Signed)
ok 

## 2016-03-17 NOTE — Telephone Encounter (Signed)
Ok to refill Ativan? 

## 2016-03-17 NOTE — Telephone Encounter (Signed)
Medication called to pharmacy. 

## 2016-04-12 ENCOUNTER — Encounter: Payer: Self-pay | Admitting: Family Medicine

## 2016-04-12 ENCOUNTER — Ambulatory Visit (INDEPENDENT_AMBULATORY_CARE_PROVIDER_SITE_OTHER): Payer: BLUE CROSS/BLUE SHIELD | Admitting: Family Medicine

## 2016-04-12 VITALS — BP 152/94 | HR 88 | Temp 98.9°F | Resp 16 | Ht 66.0 in | Wt 130.0 lb

## 2016-04-12 DIAGNOSIS — M5431 Sciatica, right side: Secondary | ICD-10-CM

## 2016-04-12 MED ORDER — PREDNISONE 20 MG PO TABS: ORAL_TABLET | ORAL | 0 refills | Status: DC

## 2016-04-12 NOTE — Progress Notes (Signed)
Subjective:    Patient ID: Kristy Butler, female    DOB: 01/29/1961, 55 y.o.   MRN: [id number]  HPI Patient reports 6 weeks of pain in her lower back. Pain radiates into both buttocks. It radiates down both legs more so on the right down below the level of the knee. The pain is deep and intense. It is worse with prolonged standing and walking. She has a positive straight leg raise on the right. She has normal reflexes and normal muscle strength in both legs. There is no sensory deficit in either leg. However the pain is lingering and gets worse throughout the day. She denies any numbness or tingling in her toes. She denies any bowel or bladder incontinence or other signs of cauda equina syndrome Past Medical History:  Diagnosis Date  . Anxiety   . Arthritis    "top of my neck, spine, right knee" (04/04/2013)  . Depression   . GERD (gastroesophageal reflux disease)   . Migraines    "last one was ~ 5 yr ago" (04/04/2013)  . Shortness of breath    "just related to my heart issues right now" (04/04/2013)  . Urinary incontinence    Past Surgical History:  Procedure Laterality Date  . GREAT TOE ARTHRODESIS, INTERPHALANGEAL JOINT Right 2010   "replaced joint in my foot" (04/04/2013)  . JOINT REPLACEMENT    . TUBAL LIGATION  ~ 1993  . TURBINATE RESECTION Bilateral ~ 1990   Current Outpatient Prescriptions on File Prior to Visit  Medication Sig Dispense Refill  . buPROPion (WELLBUTRIN SR) 150 MG 12 hr tablet TAKE 1 TABLET BY MOUTH TWICE DAILY 60 tablet 11  . fluticasone (FLONASE) 50 MCG/ACT nasal spray Place 2 sprays into both nostrils daily. 16 g 2  . LORazepam (ATIVAN) 0.5 MG tablet TAKE 1 TABLET BY MOUTH THREE TIMES DAILY AS NEEDED FOR ANXIETY 90 tablet 0  . meloxicam (MOBIC) 15 MG tablet TAKE 1 TABLET(15 MG) BY MOUTH DAILY 30 tablet 0  . oxybutynin (DITROPAN-XL) 10 MG 24 hr tablet TAKE 1 TABLETS BY MOUTH EVERY DAY 30 tablet 5  . rizatriptan (MAXALT-MLT) 10 MG disintegrating tablet  TAKE 1 TABLET BY MOUTH AS NEEDED FOR MIGRAINE. MAY REPEAT IN 2 HOURS IF NEEDED. 10 tablet 0  . tretinoin (RETIN-A) 0.025 % gel Apply topically at bedtime. 45 g 0  . venlafaxine XR (EFFEXOR-XR) 150 MG 24 hr capsule TAKE 1 CAPSULE BY MOUTH EVERY DAY WITH BREAKFAST 30 capsule 0   No current facility-administered medications on file prior to visit.    Allergies  Allergen Reactions  . Sulfa Antibiotics Other (See Comments)    Reaction unknown   Social History   Social History  . Marital status: Divorced    Spouse name: N/A  . Number of children: N/A  . Years of education: N/A   Occupational History  . Not on file.   Social History Main Topics  . Smoking status: Current Every Day Smoker    Packs/day: 0.50    Years: 20.00    Types: Cigarettes  . Smokeless tobacco: Never Used  . Alcohol use No  . Drug use: No  . Sexual activity: Yes   Other Topics Concern  . Not on file   Social History Narrative  . No narrative on file      Review of Systems  All other systems reviewed and are negative.      Objective:   Physical Exam  Cardiovascular: Normal rate and regular rhythm.  Pulmonary/Chest: Effort normal and breath sounds normal.  Musculoskeletal:       Lumbar back: She exhibits decreased range of motion and pain. She exhibits no tenderness.       Right upper leg: She exhibits tenderness. She exhibits no bony tenderness, no swelling and no edema.  Vitals reviewed.         Assessment & Plan:  Right sided sciatica - Plan: predniSONE (DELTASONE) 20 MG tablet, DG Lumbar Spine Complete  Some of her pain sounds like a pulled hamstring I believe she is experiencing right-sided sciatica secondary to herniated disc and nerve impingement in the lumbar spine. Proceed with a prednisone taper pack as well as an x-ray of the lumbar spine to evaluate further

## 2016-04-13 ENCOUNTER — Other Ambulatory Visit: Payer: Self-pay | Admitting: Family Medicine

## 2016-04-14 ENCOUNTER — Ambulatory Visit
Admission: RE | Admit: 2016-04-14 | Discharge: 2016-04-14 | Disposition: A | Discharge status: Home / Self Care | Payer: BLUE CROSS/BLUE SHIELD | Source: Ambulatory Visit | Attending: Family Medicine | Admitting: Family Medicine

## 2016-04-14 DIAGNOSIS — M5431 Sciatica, right side: Secondary | ICD-10-CM

## 2016-04-20 ENCOUNTER — Other Ambulatory Visit: Payer: Self-pay | Admitting: Family Medicine

## 2016-04-20 NOTE — Telephone Encounter (Signed)
okay

## 2016-04-20 NOTE — Telephone Encounter (Signed)
Ok to refill??  Last office visit 04/12/2016.  Last refill 03/17/2016.

## 2016-05-17 ENCOUNTER — Other Ambulatory Visit: Payer: Self-pay | Admitting: Family Medicine

## 2016-05-17 NOTE — Telephone Encounter (Signed)
Last Ov 5-1 Last refill  11-15 Ok to refill?

## 2016-05-17 NOTE — Telephone Encounter (Signed)
ok 

## 2016-05-23 NOTE — Telephone Encounter (Signed)
Medication called/sent to requested pharmacy  

## 2016-05-26 ENCOUNTER — Other Ambulatory Visit: Payer: Self-pay | Admitting: Family Medicine

## 2016-06-20 ENCOUNTER — Other Ambulatory Visit: Payer: Self-pay | Admitting: Family Medicine

## 2016-06-20 NOTE — Telephone Encounter (Signed)
Medication refilled per protocol. 

## 2016-06-24 ENCOUNTER — Other Ambulatory Visit: Payer: Self-pay | Admitting: Family Medicine

## 2016-06-24 NOTE — Telephone Encounter (Signed)
Last OV 5-1 Last refill 12-18 Okay to refill?

## 2016-06-24 NOTE — Telephone Encounter (Signed)
ok 

## 2016-06-28 NOTE — Telephone Encounter (Signed)
Rx called in 

## 2016-07-28 ENCOUNTER — Telehealth: Payer: Self-pay | Admitting: Family Medicine

## 2016-07-28 MED ORDER — LORAZEPAM 0.5 MG PO TABS: 0.5000 mg | ORAL_TABLET | Freq: Three times a day (TID) | ORAL | 2 refills | Status: DC | PRN

## 2016-07-28 NOTE — Telephone Encounter (Signed)
ok 

## 2016-07-28 NOTE — Telephone Encounter (Signed)
Requesting refill on Lorazepam - Ok to refill??    (with refills???)

## 2016-07-28 NOTE — Telephone Encounter (Signed)
Medication called/sent to requested pharmacy and pt aware 

## 2016-09-27 ENCOUNTER — Encounter: Payer: Self-pay | Admitting: Family Medicine

## 2016-09-27 ENCOUNTER — Ambulatory Visit (INDEPENDENT_AMBULATORY_CARE_PROVIDER_SITE_OTHER): Payer: BLUE CROSS/BLUE SHIELD | Admitting: Family Medicine

## 2016-09-27 VITALS — BP 130/74 | HR 82 | Temp 98.7°F | Resp 14 | Ht 66.0 in | Wt 130.0 lb

## 2016-09-27 DIAGNOSIS — M170 Bilateral primary osteoarthritis of knee: Secondary | ICD-10-CM

## 2016-09-27 DIAGNOSIS — S39012A Strain of muscle, fascia and tendon of lower back, initial encounter: Secondary | ICD-10-CM | POA: Diagnosis not present

## 2016-09-27 DIAGNOSIS — Z818 Family history of other mental and behavioral disorders: Secondary | ICD-10-CM | POA: Diagnosis not present

## 2016-09-27 DIAGNOSIS — R079 Chest pain, unspecified: Secondary | ICD-10-CM

## 2016-09-27 MED ORDER — TRETINOIN 0.025 % EX GEL: Freq: Every day | CUTANEOUS | 0 refills | Status: DC

## 2016-09-27 MED ORDER — CYCLOBENZAPRINE HCL 10 MG PO TABS: 10.0000 mg | ORAL_TABLET | Freq: Three times a day (TID) | ORAL | 0 refills | Status: DC | PRN

## 2016-09-27 MED ORDER — HYDROCODONE-ACETAMINOPHEN 5-325 MG PO TABS: 1.0000 | ORAL_TABLET | Freq: Four times a day (QID) | ORAL | 0 refills | Status: DC | PRN
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
## 2016-09-27 NOTE — Patient Instructions (Addendum)
Referral to cardiology  Take pain medication as prescribed Take muscle relaxer F/U if not improved

## 2016-09-28 LAB — CBC WITH DIFFERENTIAL/PLATELET
Basophils Absolute: 79 cells/uL (ref 0–200)
Basophils Relative: 1 %
Eosinophils Absolute: 158 cells/uL (ref 15–500)
Eosinophils Relative: 2 %
HCT: 40.5 % (ref 35.0–45.0)
Hemoglobin: 13.4 g/dL (ref 12.0–15.0)
Lymphocytes Relative: 41 %
Lymphs Abs: 3239 cells/uL (ref 850–3900)
MCH: 31.8 pg (ref 27.0–33.0)
MCHC: 33.1 g/dL (ref 32.0–36.0)
MCV: 96.2 fL (ref 80.0–100.0)
MPV: 9.3 fL (ref 7.5–12.5)
Monocytes Absolute: 474 cells/uL (ref 200–950)
Monocytes Relative: 6 %
Neutro Abs: 3950 cells/uL (ref 1500–7800)
Neutrophils Relative %: 50 %
Platelets: 264 10*3/uL (ref 140–400)
RBC: 4.21 MIL/uL (ref 3.80–5.10)
RDW: 14.2 % (ref 11.0–15.0)
WBC: 7.9 10*3/uL (ref 3.8–10.8)

## 2016-09-28 LAB — COMPREHENSIVE METABOLIC PANEL
ALT: 13 U/L (ref 6–29)
AST: 16 U/L (ref 10–35)
Albumin: 4.4 g/dL (ref 3.6–5.1)
Alkaline Phosphatase: 74 U/L (ref 33–130)
BUN: 12 mg/dL (ref 7–25)
CO2: 30 mmol/L (ref 20–31)
Calcium: 9.3 mg/dL (ref 8.6–10.4)
Chloride: 106 mmol/L (ref 98–110)
Creat: 0.79 mg/dL (ref 0.50–1.05)
Glucose, Bld: 64 mg/dL — ABNORMAL LOW (ref 70–99)
Potassium: 3.9 mmol/L (ref 3.5–5.3)
Sodium: 142 mmol/L (ref 135–146)
Total Bilirubin: 0.4 mg/dL (ref 0.2–1.2)
Total Protein: 7 g/dL (ref 6.1–8.1)

## 2016-09-29 ENCOUNTER — Telehealth: Payer: Self-pay | Admitting: *Deleted

## 2016-09-29 MED ORDER — TRETINOIN 0.025 % EX GEL: Freq: Every day | CUTANEOUS | 0 refills | Status: DC

## 2016-09-29 NOTE — Telephone Encounter (Signed)
Received PA determination.   PA approved 09/29/2016 through 09/28/2018  Pharmacy made aware.

## 2016-09-29 NOTE — Telephone Encounter (Signed)
Received request from pharmacy for PA on Retin A gel.   PA submitted.   Dx: L70.9- Ance

## 2016-09-30 ENCOUNTER — Ambulatory Visit (INDEPENDENT_AMBULATORY_CARE_PROVIDER_SITE_OTHER): Payer: BLUE CROSS/BLUE SHIELD | Admitting: Cardiology

## 2016-09-30 ENCOUNTER — Encounter: Payer: Self-pay | Admitting: Cardiology

## 2016-09-30 VITALS — BP 110/78 | HR 93 | Ht 66.0 in | Wt 128.8 lb

## 2016-09-30 DIAGNOSIS — R9431 Abnormal electrocardiogram [ECG] [EKG]: Secondary | ICD-10-CM

## 2016-09-30 DIAGNOSIS — Z720 Tobacco use: Secondary | ICD-10-CM | POA: Diagnosis not present

## 2016-09-30 DIAGNOSIS — R079 Chest pain, unspecified: Secondary | ICD-10-CM
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
## 2016-09-30 NOTE — Patient Instructions (Signed)
Medication Instructions:  None Ordered   Labwork: None Ordered   Testing/Procedures: Your physician has requested that you have en exercise stress myoview. For further information please visit [email or web address]. Please follow instruction sheet, as given.  A chest x-ray takes a picture of the organs and structures inside the chest, including the heart, lungs, and blood vessels. This test can show several things, including, whether the heart is enlarges; whether fluid is building up in the lungs; and whether pacemaker / defibrillator leads are still in place.   Follow-Up: Your physician recommends that you schedule a follow-up appointment in: after results have been resulted.   Any Other Special Instructions Will Be Listed Below (If Applicable).     If you need a refill on your cardiac medications before your next appointment, please call your pharmacy.

## 2016-10-03 ENCOUNTER — Telehealth (HOSPITAL_COMMUNITY): Payer: Self-pay | Admitting: *Deleted

## 2016-10-03 NOTE — Telephone Encounter (Signed)
Left message on voicemail in reference to upcoming appointment scheduled for 10/05/16. Phone number given for a call back so details instructions can be given. Kristy Butler

## 2016-10-04 ENCOUNTER — Telehealth (HOSPITAL_COMMUNITY): Payer: Self-pay | Admitting: *Deleted

## 2016-10-04 NOTE — Telephone Encounter (Signed)
Left message on voicemail in reference to upcoming appointment scheduled for 10/05/16. Phone number given for a call back so details instructions can be given. Maikayla Beggs, Ranae Palms

## 2016-10-05 ENCOUNTER — Ambulatory Visit (HOSPITAL_COMMUNITY): Payer: BLUE CROSS/BLUE SHIELD | Attending: Cardiology

## 2016-10-05 DIAGNOSIS — R079 Chest pain, unspecified: Secondary | ICD-10-CM

## 2016-10-05 LAB — MYOCARDIAL PERFUSION IMAGING
Estimated workload: 5.5 METS
Exercise duration (min): 4 min
LV dias vol: 97 mL (ref 46–106)
LV sys vol: 46 mL
MPHR: 165 {beats}/min
Peak HR: 151 {beats}/min
Percent HR: 92 %
Percent of predicted max HR: 91 %
RATE: 0.38
RPE: 19
Rest HR: 75 {beats}/min
SDS: 6
SRS: 0
SSS: 6
Stage 1 DBP: 81 mmHg
Stage 1 Grade: 0 %
Stage 1 HR: 76 {beats}/min
Stage 1 SBP: 135 mmHg
Stage 1 Speed: 0 mph
Stage 2 DBP: 85 mmHg
Stage 2 Grade: 0 %
Stage 2 HR: 88 {beats}/min
Stage 2 SBP: 134 mmHg
Stage 2 Speed: 0 mph
Stage 3 Grade: 0.2 %
Stage 3 HR: 89 {beats}/min
Stage 3 Speed: 0.1 mph
Stage 4 DBP: 86 mmHg
Stage 4 Grade: 10 %
Stage 4 HR: 142 {beats}/min
Stage 4 SBP: 211 mmHg
Stage 4 Speed: 1.7 mph
Stage 5 Grade: 12 %
Stage 5 HR: 151 {beats}/min
Stage 5 Speed: 2.3 mph
Stage 6 DBP: 87 mmHg
Stage 6 Grade: 0 %
Stage 6 HR: 103 {beats}/min
Stage 6 SBP: 213 mmHg
Stage 6 Speed: 0 mph
Stage 7 DBP: 79 mmHg
Stage 7 Grade: 0 %
Stage 7 HR: 78 {beats}/min
Stage 7 SBP: 128 mmHg
Stage 7 Speed: 0 mph
TID: 0.96

## 2016-10-05 MED ORDER — TECHNETIUM TC 99M TETROFOSMIN IV KIT
32.5000 | PACK | Freq: Once | INTRAVENOUS | Status: AC | PRN
  Administered 2016-10-05: 32.5 via INTRAVENOUS
  Filled 2016-10-05: qty 33

## 2016-10-05 MED ORDER — TECHNETIUM TC 99M TETROFOSMIN IV KIT
10.8000 | PACK | Freq: Once | INTRAVENOUS | Status: AC | PRN
  Administered 2016-10-05: 10.8 via INTRAVENOUS
  Filled 2016-10-05: qty 11

## 2016-10-10 ENCOUNTER — Encounter: Payer: Self-pay | Admitting: Cardiology

## 2016-10-10 ENCOUNTER — Ambulatory Visit (INDEPENDENT_AMBULATORY_CARE_PROVIDER_SITE_OTHER): Payer: BLUE CROSS/BLUE SHIELD | Admitting: Cardiology

## 2016-10-10 VITALS — BP 110/70 | HR 94 | Ht 66.0 in | Wt 126.8 lb

## 2016-10-10 DIAGNOSIS — R9439 Abnormal result of other cardiovascular function study: Secondary | ICD-10-CM | POA: Diagnosis not present

## 2016-10-10 DIAGNOSIS — Z720 Tobacco use: Secondary | ICD-10-CM | POA: Diagnosis not present

## 2016-10-10 DIAGNOSIS — R079 Chest pain, unspecified: Secondary | ICD-10-CM | POA: Diagnosis not present

## 2016-10-10 DIAGNOSIS — R9431 Abnormal electrocardiogram [ECG] [EKG]: Secondary | ICD-10-CM | POA: Diagnosis not present

## 2016-10-10 DIAGNOSIS — Z01812 Encounter for preprocedural laboratory examination: Secondary | ICD-10-CM | POA: Diagnosis not present

## 2016-10-10 NOTE — Patient Instructions (Addendum)
Medication Instructions:  None Ordered   Labwork: Your physician recommends that you return for lab work TODAY for BMET, CBC, PT/INR  Testing/Procedures: Your physician has requested that you have a left cardiac catheterization. Cardiac catheterization is used to diagnose and/or treat various heart conditions. Doctors may recommend this procedure for a number of different reasons. The most common reason is to evaluate chest pain. Chest pain can be a symptom of coronary artery disease (CAD), and cardiac catheterization can show whether plaque is narrowing or blocking your heart's arteries. This procedure is also used to evaluate the valves, as well as measure the blood flow and oxygen levels in different parts of your heart. For further information please visit [email or web address]. Please follow instruction sheet, as given.     Follow-Up: None Ordered   Any Other Special Instructions Will Be Listed Below (If Applicable).    [facility] [address] [place] [address] [place] [address] Dept: [phone] Loc: [place]  10/10/2016  You are scheduled for a left cardiac cath on Wednesday May 9th,  with Dr. Angelena Form.  1. Please arrive at the [facility] (Main Entrance A) at [facility]: [address] [place], [place] [address] at 11:00 (two hours before your procedure to ensure your preparation). Free valet parking service is available.   Special note: Every effort is made to have your procedure done on time. Please understand that emergencies sometimes delay scheduled procedures.  2. Diet: nothing to eat or drink after midnight the night before.   3. Labs: you will have your labs drawn today 10/10/2016  4. Medication instructions in preparation for your procedure:    On the morning of your procedure, take an 81 MG aspirin.  You may use sips of water.  5. Plan for one  night stay--bring personal belongings. 6. Bring a current list of your medications and current insurance cards. 7. You MUST have a responsible person to drive you home. 8. Someone MUST be with you the first 24 hours after you arrive home or your discharge will be delayed. 9. Please wear clothes that are easy to get on and off and wear slip-on shoes.  Thank you for allowing [name] to care for you!   -- [facility]      If you need a refill on your cardiac medications before your next appointment, please call your pharmacy.  Thank you for choosing [facility]
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
## 2016-10-11 LAB — CBC
Hematocrit: 39 % (ref 34.0–46.6)
Hemoglobin: 12.9 g/dL (ref 11.1–15.9)
MCH: 31.6 pg (ref 26.6–33.0)
MCHC: 33.1 g/dL (ref 31.5–35.7)
MCV: 96 fL (ref 79–97)
Platelets: 254 10*3/uL (ref 150–379)
RBC: 4.08 x10E6/uL (ref 3.77–5.28)
RDW: 14.4 % (ref 12.3–15.4)
WBC: 7.9 10*3/uL (ref 3.4–10.8)

## 2016-10-11 LAB — BASIC METABOLIC PANEL
BUN/Creatinine Ratio: 15 (ref 9–23)
BUN: 14 mg/dL (ref 6–24)
CO2: 25 mmol/L (ref 18–29)
Calcium: 9.1 mg/dL (ref 8.7–10.2)
Chloride: 103 mmol/L (ref 96–106)
Creatinine, Ser: 0.93 mg/dL (ref 0.57–1.00)
GFR calc Af Amer: 80 mL/min/{1.73_m2} (ref >59)
GFR calc non Af Amer: 69 mL/min/{1.73_m2} (ref >59)
Glucose: 85 mg/dL (ref 65–99)
Potassium: 4.1 mmol/L (ref 3.5–5.2)
Sodium: 145 mmol/L — ABNORMAL HIGH (ref 134–144)

## 2016-10-11 LAB — PROTIME-INR
INR: 0.9 (ref 0.8–1.2)
Prothrombin Time: 10 s (ref 9.1–12.0)

## 2016-10-12 ENCOUNTER — Encounter (HOSPITAL_COMMUNITY)
Admission: RE | Disposition: A | Discharge status: Home / Self Care | Payer: Self-pay | Source: Ambulatory Visit | Attending: Cardiovascular Disease

## 2016-10-12 ENCOUNTER — Ambulatory Visit (HOSPITAL_COMMUNITY)
Admission: RE | Admit: 2016-10-12 | Discharge: 2016-10-12 | Disposition: A | Discharge status: Home / Self Care | Payer: BLUE CROSS/BLUE SHIELD | Source: Ambulatory Visit | Attending: Cardiovascular Disease | Admitting: Cardiovascular Disease

## 2016-10-12 DIAGNOSIS — Z882 Allergy status to sulfonamides status: Secondary | ICD-10-CM | POA: Insufficient documentation

## 2016-10-12 DIAGNOSIS — R9439 Abnormal result of other cardiovascular function study: Secondary | ICD-10-CM

## 2016-10-12 DIAGNOSIS — K219 Gastro-esophageal reflux disease without esophagitis: Secondary | ICD-10-CM | POA: Insufficient documentation

## 2016-10-12 DIAGNOSIS — F419 Anxiety disorder, unspecified: Secondary | ICD-10-CM | POA: Diagnosis not present

## 2016-10-12 DIAGNOSIS — R9431 Abnormal electrocardiogram [ECG] [EKG]: Secondary | ICD-10-CM | POA: Insufficient documentation

## 2016-10-12 DIAGNOSIS — R32 Unspecified urinary incontinence: Secondary | ICD-10-CM | POA: Insufficient documentation

## 2016-10-12 DIAGNOSIS — Z8249 Family history of ischemic heart disease and other diseases of the circulatory system: Secondary | ICD-10-CM | POA: Diagnosis not present

## 2016-10-12 DIAGNOSIS — F329 Major depressive disorder, single episode, unspecified: Secondary | ICD-10-CM | POA: Insufficient documentation

## 2016-10-12 DIAGNOSIS — R0789 Other chest pain: Secondary | ICD-10-CM | POA: Insufficient documentation

## 2016-10-12 DIAGNOSIS — F17210 Nicotine dependence, cigarettes, uncomplicated: Secondary | ICD-10-CM | POA: Diagnosis not present

## 2016-10-12 HISTORY — PX: LEFT HEART CATH AND CORONARY ANGIOGRAPHY: CATH118249

## 2016-10-12 SURGERY — LEFT HEART CATH AND CORONARY ANGIOGRAPHY
Anesthesia: LOCAL

## 2016-10-12 MED ORDER — HEPARIN (PORCINE) IN NACL 2-0.9 UNIT/ML-% IJ SOLN
INTRAMUSCULAR | Status: DC | PRN
Start: 2016-10-12 — End: 2016-10-12
  Administered 2016-10-12: 1000 mL

## 2016-10-12 MED ORDER — SODIUM CHLORIDE 0.9% FLUSH: 3.0000 mL | INTRAVENOUS | Status: DC | PRN

## 2016-10-12 MED ORDER — SODIUM CHLORIDE 0.9% FLUSH: 3.0000 mL | Freq: Two times a day (BID) | INTRAVENOUS | Status: DC

## 2016-10-12 MED ORDER — SODIUM CHLORIDE 0.9 % WEIGHT BASED INFUSION
1.0000 mL/kg/h | INTRAVENOUS | Status: DC
Start: 2016-10-13 — End: 2016-10-12

## 2016-10-12 MED ORDER — IOPAMIDOL (ISOVUE-370) INJECTION 76%
INTRAVENOUS | Status: DC | PRN
  Administered 2016-10-12: 35 mL via INTRA_ARTERIAL

## 2016-10-12 MED ORDER — HEPARIN (PORCINE) IN NACL 2-0.9 UNIT/ML-% IJ SOLN
INTRAMUSCULAR | Status: AC
  Filled 2016-10-12: qty 1000

## 2016-10-12 MED ORDER — VERAPAMIL HCL 2.5 MG/ML IV SOLN
INTRAVENOUS | Status: DC | PRN
  Administered 2016-10-12: 10 mL via INTRA_ARTERIAL

## 2016-10-12 MED ORDER — MIDAZOLAM HCL 2 MG/2ML IJ SOLN
INTRAMUSCULAR | Status: AC
  Filled 2016-10-12: qty 2

## 2016-10-12 MED ORDER — SODIUM CHLORIDE 0.9 % IV SOLN: 250.0000 mL | INTRAVENOUS | Status: DC | PRN

## 2016-10-12 MED ORDER — MIDAZOLAM HCL 2 MG/2ML IJ SOLN
INTRAMUSCULAR | Status: DC | PRN
  Administered 2016-10-12 (×2): 1 mg via INTRAVENOUS

## 2016-10-12 MED ORDER — SODIUM CHLORIDE 0.9 % IV SOLN: INTRAVENOUS | Status: AC

## 2016-10-12 MED ORDER — HEPARIN SODIUM (PORCINE) 1000 UNIT/ML IJ SOLN
INTRAMUSCULAR | Status: DC | PRN
  Administered 2016-10-12: 3000 [IU] via INTRAVENOUS

## 2016-10-12 MED ORDER — IOPAMIDOL (ISOVUE-370) INJECTION 76%
INTRAVENOUS | Status: AC
  Filled 2016-10-12: qty 100

## 2016-10-12 MED ORDER — HEPARIN SODIUM (PORCINE) 1000 UNIT/ML IJ SOLN
INTRAMUSCULAR | Status: AC
  Filled 2016-10-12: qty 1

## 2016-10-12 MED ORDER — LIDOCAINE HCL (PF) 1 % IJ SOLN
INTRAMUSCULAR | Status: DC | PRN
  Administered 2016-10-12: 2 mL via INTRADERMAL

## 2016-10-12 MED ORDER — FENTANYL CITRATE (PF) 100 MCG/2ML IJ SOLN
INTRAMUSCULAR | Status: AC
  Filled 2016-10-12: qty 2

## 2016-10-12 MED ORDER — SODIUM CHLORIDE 0.9 % WEIGHT BASED INFUSION
3.0000 mL/kg/h | INTRAVENOUS | Status: DC
  Administered 2016-10-12: 3 mL/kg/h via INTRAVENOUS

## 2016-10-12 MED ORDER — VERAPAMIL HCL 2.5 MG/ML IV SOLN
INTRAVENOUS | Status: AC
  Filled 2016-10-12: qty 2

## 2016-10-12 MED ORDER — FENTANYL CITRATE (PF) 100 MCG/2ML IJ SOLN
INTRAMUSCULAR | Status: DC | PRN
  Administered 2016-10-12: 50 ug via INTRAVENOUS

## 2016-10-12 MED ORDER — ASPIRIN 81 MG PO CHEW: 81.0000 mg | CHEWABLE_TABLET | ORAL | Status: DC

## 2016-10-12 MED ORDER — LIDOCAINE HCL (PF) 1 % IJ SOLN
INTRAMUSCULAR | Status: AC
  Filled 2016-10-12: qty 30

## 2016-10-12 SURGICAL SUPPLY — 10 items
CATH OPTITORQUE JACKY 4.0 5F (CATHETERS) ×2 IMPLANT
DEVICE RAD COMP TR BAND LRG (VASCULAR PRODUCTS) ×2 IMPLANT
GLIDESHEATH SLEND SS 6F .021 (SHEATH) ×2 IMPLANT
GUIDEWIRE INQWIRE 1.5J.035X260 (WIRE) ×1 IMPLANT
INQWIRE 1.5J .035X260CM (WIRE) ×2
KIT HEART LEFT (KITS) ×2 IMPLANT
PACK CARDIAC CATHETERIZATION (CUSTOM PROCEDURE TRAY) ×2 IMPLANT
TRANSDUCER W/STOPCOCK (MISCELLANEOUS) ×2 IMPLANT
TUBING CIL FLEX 10 FLL-RA (TUBING) ×2 IMPLANT
WIRE HI TORQ VERSACORE-J 145CM (WIRE) ×2 IMPLANT
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
## 2016-10-12 NOTE — Interval H&P Note (Signed)
Cath Lab Visit (complete for each Cath Lab visit)  Clinical Evaluation Leading to the Procedure:   ACS: No.  Non-ACS:    Anginal Classification: CCS III  Anti-ischemic medical therapy: No Therapy  Non-Invasive Test Results: Low-risk stress test findings: cardiac mortality <1%/year  Prior CABG: No previous CABG      History and Physical Interval Note:  10/12/2016 4:29 PM  Kristy Butler  has presented today for surgery, with the diagnosis of cp - abnormal stress test  The various methods of treatment have been discussed with the patient and family. After consideration of risks, benefits and other options for treatment, the patient has consented to  Procedure(s): Left Heart Cath and Coronary Angiography (N/A) as a surgical intervention .  The patient's history has been reviewed, patient examined, no change in status, stable for surgery.  I have reviewed the patient's chart and labs.  Questions were answered to the patient's satisfaction.     [name]

## 2016-10-12 NOTE — Discharge Instructions (Signed)
Radial Site Care °Refer to this sheet in the next few weeks. These instructions provide you with information about caring for yourself after your procedure. Your health care provider may also give you more specific instructions. Your treatment has been planned according to current medical practices, but problems sometimes occur. Call your health care provider if you have any problems or questions after your procedure. °What can I expect after the procedure? °After your procedure, it is typical to have the following: °· Bruising at the radial site that usually fades within 1-2 weeks. °· Blood collecting in the tissue (hematoma) that may be painful to the touch. It should usually decrease in size and tenderness within 1-2 weeks. °Follow these instructions at home: °· Take medicines only as directed by your health care provider. °· You may shower 24-48 hours after the procedure or as directed by your health care provider. Remove the bandage (dressing) and gently wash the site with plain soap and water. Pat the area dry with a clean towel. Do not rub the site, because this may cause bleeding. °· Do not take baths, swim, or use a hot tub until your health care provider approves. °· Check your insertion site every day for redness, swelling, or drainage. °· Do not apply powder or lotion to the site. °· Do not flex or bend the affected arm for 24 hours or as directed by your health care provider. °· Do not push or pull heavy objects with the affected arm for 24 hours or as directed by your health care provider. °· Do not lift over 10 lb (4.5 kg) for 5 days after your procedure or as directed by your health care provider. °· Ask your health care provider when it is okay to: °¨ Return to work or school. °¨ Resume usual physical activities or sports. °¨ Resume sexual activity. °· Do not drive home if you are discharged the same day as the procedure. Have someone else drive you. °· You may drive 24 hours after the procedure  unless otherwise instructed by your health care provider. °· Do not operate machinery or power tools for 24 hours after the procedure. °· If your procedure was done as an outpatient procedure, which means that you went home the same day as your procedure, a responsible adult should be with you for the first 24 hours after you arrive home. °· Keep all follow-up visits as directed by your health care provider. This is important. °Contact a health care provider if: °· You have a fever. °· You have chills. °· You have increased bleeding from the radial site. Hold pressure on the site. °Get help right away if: °· You have unusual pain at the radial site. °· You have redness, warmth, or swelling at the radial site. °· You have drainage (other than a small amount of blood on the dressing) from the radial site. °· The radial site is bleeding, and the bleeding does not stop after 30 minutes of holding steady pressure on the site. °· Your arm or hand becomes pale, cool, tingly, or numb. °This information is not intended to replace advice given to you by your health care provider. Make sure you discuss any questions you have with your health care provider. °Document Released: 06/25/2010 Document Revised: 10/29/2015 Document Reviewed: 12/09/2013 °Elsevier Interactive Patient Education © 2017 [employer] ° °

## 2016-10-13 ENCOUNTER — Encounter (HOSPITAL_COMMUNITY): Payer: Self-pay | Admitting: Cardiovascular Disease

## 2016-10-14 ENCOUNTER — Other Ambulatory Visit: Payer: Self-pay | Admitting: Family Medicine

## 2016-10-14 NOTE — Telephone Encounter (Signed)
Medication called to pharmacy. 

## 2016-10-14 NOTE — Telephone Encounter (Signed)
Ok to refill 

## 2016-10-14 NOTE — Telephone Encounter (Signed)
ok 

## 2016-10-17 ENCOUNTER — Ambulatory Visit: Payer: BLUE CROSS/BLUE SHIELD | Admitting: Cardiology

## 2016-10-20 ENCOUNTER — Other Ambulatory Visit: Payer: Self-pay | Admitting: Family Medicine

## 2016-10-20 NOTE — Telephone Encounter (Signed)
Ok to refill Ativan?

## 2016-10-20 NOTE — Telephone Encounter (Signed)
ok 

## 2016-10-20 NOTE — Telephone Encounter (Signed)
Medication called to pharmacy. 

## 2016-11-22 ENCOUNTER — Other Ambulatory Visit: Payer: Self-pay | Admitting: Family Medicine

## 2016-12-21 ENCOUNTER — Other Ambulatory Visit: Payer: Self-pay | Admitting: Family Medicine

## 2017-01-22 ENCOUNTER — Other Ambulatory Visit: Payer: Self-pay | Admitting: Family Medicine

## 2017-03-29 ENCOUNTER — Telehealth: Payer: Self-pay | Admitting: Family Medicine

## 2017-03-29 MED ORDER — VENLAFAXINE HCL ER 150 MG PO CP24: ORAL_CAPSULE | ORAL | 11 refills | Status: DC

## 2017-03-29 NOTE — Telephone Encounter (Signed)
Pt needs refill on venlafaxine she has lost her prescription, [facility] on [address]

## 2017-03-29 NOTE — Telephone Encounter (Signed)
Medication called/sent to requested pharmacy  

## 2017-04-24 ENCOUNTER — Other Ambulatory Visit: Payer: Self-pay

## 2017-04-24 ENCOUNTER — Ambulatory Visit: Payer: 59 | Admitting: Family Medicine

## 2017-04-24 ENCOUNTER — Encounter: Payer: Self-pay | Admitting: Family Medicine

## 2017-04-24 VITALS — BP 124/64 | HR 80 | Temp 98.3°F | Resp 18 | Ht 66.0 in | Wt 129.0 lb

## 2017-04-24 DIAGNOSIS — G43909 Migraine, unspecified, not intractable, without status migrainosus: Secondary | ICD-10-CM | POA: Insufficient documentation

## 2017-04-24 DIAGNOSIS — M791 Myalgia, unspecified site: Secondary | ICD-10-CM

## 2017-04-24 DIAGNOSIS — F4329 Adjustment disorder with other symptoms: Secondary | ICD-10-CM

## 2017-04-24 DIAGNOSIS — F411 Generalized anxiety disorder: Secondary | ICD-10-CM | POA: Diagnosis not present

## 2017-04-24 DIAGNOSIS — F4381 Prolonged grief disorder: Secondary | ICD-10-CM

## 2017-04-24 DIAGNOSIS — G43809 Other migraine, not intractable, without status migrainosus: Secondary | ICD-10-CM | POA: Diagnosis not present

## 2017-04-24 DIAGNOSIS — G43009 Migraine without aura, not intractable, without status migrainosus: Secondary | ICD-10-CM

## 2017-04-24 DIAGNOSIS — F331 Major depressive disorder, recurrent, moderate: Secondary | ICD-10-CM | POA: Diagnosis not present

## 2017-04-24 MED ORDER — RIZATRIPTAN BENZOATE 10 MG PO TBDP: ORAL_TABLET | ORAL | 1 refills | Status: DC

## 2017-04-24 MED ORDER — CYCLOBENZAPRINE HCL 5 MG PO TABS: 5.0000 mg | ORAL_TABLET | Freq: Three times a day (TID) | ORAL | 1 refills | Status: DC | PRN

## 2017-04-24 MED ORDER — NAPROXEN 500 MG PO TABS: 500.0000 mg | ORAL_TABLET | Freq: Two times a day (BID) | ORAL | 2 refills | Status: DC

## 2017-04-24 MED ORDER — VENLAFAXINE HCL ER 150 MG PO CP24: ORAL_CAPSULE | ORAL | 11 refills | Status: DC

## 2017-04-24 NOTE — Patient Instructions (Signed)
Referral for therapy Take the anti-inflammatory Muscle relaxers  F/U as previous  [facility]- [phone]

## 2017-04-24 NOTE — Progress Notes (Signed)
Subjective:    Patient ID: Kristy Butler, female    DOB: [birth year], 56 y.o.   MRN: [id number]  Patient presents for Discuss Depression (is not fasting) Patient here with multiple stressors.  She started off complaining that her back hurts she has not in her back and on her neck in February she touches on her muscles aches.  States that she has been under a lot of stress recently.  Her brother died in 2022/12/05 and approximately 5 weeks later her husband died.  She then had to take over care for her mother.  She moved into her husband's old house they were still married but they were separate living in 2 different places.  She started pulling up carpet in renovating their home so that her daughter could live in her previous house.  She has been doing all this alone and doing a lot of manual labor that she is not used to.  She feels like she has a very low tolerance for interacting with people right now.  She just feels overwhelmed.  She did begin to cry during the exam.  She is currently taking Wellbutrin and Effexor.  She has lorazepam but only takes this for panic attacks and states that she has not had any.  She did not seek any type of grief counseling after both her brother and her husband died a few months ago.  She is open to doing so.  She is also had headache she has history of migraines she does not have any Maxalt.  She has been taking meloxicam this is not been helping with her muscle aches or her back discomfort.  She does have some known degenerative disc disease in her spine.  She denies any radiating symptoms.  Was seeing Dr, Ulanda Edison GYN, overdue for prevention , she has scheduled physical for Jan to address these things   Review Of Systems:  GEN- + fatigue, denies fever, weight loss,weakness, recent illness HEENT- denies eye drainage, change in vision, nasal discharge, CVS- denies chest pain, palpitations RESP- denies SOB, cough, wheeze ABD- denies N/V, change in stools, abd  pain GU- denies dysuria, hematuria, dribbling, incontinence MSK-+ joint pain, +muscle aches, injury Neuro- denies headache, dizziness, syncope, seizure activity       Objective:    BP 124/64   Pulse 80   Temp 98.3 F (36.8 C) (Oral)   Resp 18   Ht 5\' 6"  (1.676 m)   Wt 129 lb (58.5 kg)   SpO2 97%   BMI 20.82 kg/m  GEN- NAD, alert and oriented x3 HEENT- PERRL, EOMI, non injected sclera, pink conjunctiva, MMM, oropharynx clear Neck- Supple, mild spasm trapezius CVS- RRR, no murmur RESP-CTAB ABD-NABS,soft,NT,ND MSK- TTp lumbar paraspinals, neg SLR, + spasm, fair ROM neck, spine  Motor - equal bilat, sensation in tact  TTP over arms, legs, various places  EXT- No edema Psych- depressed affect  tearful at times, no SI, well groomed, no SI , jumps around subjects  Pulses- Radial, DP- 2+   PhQ 9 Score= 9        Assessment & Plan:      Problem List Items Addressed This Visit      Unprioritized   Migraines   Relevant Medications   cyclobenzaprine (FLEXERIL) 5 MG tablet   naproxen (NAPROSYN) 500 MG tablet   rizatriptan (MAXALT-MLT) 10 MG disintegrating tablet   venlafaxine XR (EFFEXOR-XR) 150 MG 24 hr capsule   MDD (major depressive disorder)    I think  that she has prolonged grief which she is not taking time to address.  She is willing to go to counseling I think that this will be the most beneficial to her.  I think that she is taking on so much physically and mentally this is causing a lot of her somatic complaints as well as her migraines.  She does have point tenderness on the muscles but I think this is overuse from what she has been doing at the house and just tension and stress in general.  I did give her a different muscle relaxer to use as well as a different anti-inflammatory will try Naprosyn twice a day and discontinue the meloxicam.  She has not followed up for her own preventative care in lab work I will check some of this today with her complaints.    With  regards to depression she is on a good regimen with Wellbutrin as well as the Effexor.  She has lorazepam if she needs it.  I think the counseling is the missing piece here.  Migraines I did refill the Maxalt.        Relevant Medications   venlafaxine XR (EFFEXOR-XR) 150 MG 24 hr capsule   GAD (generalized anxiety disorder)   Relevant Medications   venlafaxine XR (EFFEXOR-XR) 150 MG 24 hr capsule    Other Visit Diagnoses    Myalgia    -  Primary   Relevant Orders   CBC with Differential/Platelet   Comprehensive metabolic panel   CK   Sedimentation Rate   C-reactive protein   Prolonged grief disorder          Note: This dictation was prepared with Dragon dictation along with smaller phrase technology. Any transcriptional errors that result from this process are unintentional.

## 2017-04-24 NOTE — Assessment & Plan Note (Signed)
I think that she has prolonged grief which she is not taking time to address.  She is willing to go to counseling I think that this will be the most beneficial to her.  I think that she is taking on so much physically and mentally this is causing a lot of her somatic complaints as well as her migraines.  She does have point tenderness on the muscles but I think this is overuse from what she has been doing at the house and just tension and stress in general.  I did give her a different muscle relaxer to use as well as a different anti-inflammatory will try Naprosyn twice a day and discontinue the meloxicam.  She has not followed up for her own preventative care in lab work I will check some of this today with her complaints.    With regards to depression she is on a good regimen with Wellbutrin as well as the Effexor.  She has lorazepam if she needs it.  I think the counseling is the missing piece here.  Migraines I did refill the Maxalt.

## 2017-04-25 LAB — SEDIMENTATION RATE: Sed Rate: 2 mm/h (ref 0–30)

## 2017-04-25 LAB — CBC WITH DIFFERENTIAL/PLATELET
Basophils Absolute: 53 cells/uL (ref 0–200)
Basophils Relative: 0.8 %
Eosinophils Absolute: 284 cells/uL (ref 15–500)
Eosinophils Relative: 4.3 %
HCT: 37.5 % (ref 35.0–45.0)
Hemoglobin: 12.8 g/dL (ref 11.7–15.5)
Lymphs Abs: 2838 cells/uL (ref 850–3900)
MCH: 31.7 pg (ref 27.0–33.0)
MCHC: 34.1 g/dL (ref 32.0–36.0)
MCV: 92.8 fL (ref 80.0–100.0)
MPV: 9.4 fL (ref 7.5–12.5)
Monocytes Relative: 7.3 %
Neutro Abs: 2944 cells/uL (ref 1500–7800)
Neutrophils Relative %: 44.6 %
Platelets: 252 10*3/uL (ref 140–400)
RBC: 4.04 10*6/uL (ref 3.80–5.10)
RDW: 13.3 % (ref 11.0–15.0)
Total Lymphocyte: 43 %
WBC mixed population: 482 cells/uL (ref 200–950)
WBC: 6.6 10*3/uL (ref 3.8–10.8)

## 2017-04-25 LAB — CK: Total CK: 82 U/L (ref 29–143)

## 2017-04-25 LAB — COMPREHENSIVE METABOLIC PANEL
AG Ratio: 1.6 (calc) (ref 1.0–2.5)
ALT: 12 U/L (ref 6–29)
AST: 15 U/L (ref 10–35)
Albumin: 4.1 g/dL (ref 3.6–5.1)
Alkaline phosphatase (APISO): 80 U/L (ref 33–130)
BUN: 13 mg/dL (ref 7–25)
CO2: 31 mmol/L (ref 20–32)
Calcium: 9 mg/dL (ref 8.6–10.4)
Chloride: 104 mmol/L (ref 98–110)
Creat: 0.76 mg/dL (ref 0.50–1.05)
Globulin: 2.6 g/dL (calc) (ref 1.9–3.7)
Glucose, Bld: 90 mg/dL (ref 65–99)
Potassium: 4.1 mmol/L (ref 3.5–5.3)
Sodium: 143 mmol/L (ref 135–146)
Total Bilirubin: 0.4 mg/dL (ref 0.2–1.2)
Total Protein: 6.7 g/dL (ref 6.1–8.1)

## 2017-04-25 LAB — C-REACTIVE PROTEIN: CRP: 0.3 mg/L (ref <8.0)

## 2017-05-01 ENCOUNTER — Other Ambulatory Visit: Payer: Self-pay | Admitting: Family Medicine

## 2017-05-22 ENCOUNTER — Other Ambulatory Visit: Payer: Self-pay | Admitting: Family Medicine

## 2017-05-22 NOTE — Telephone Encounter (Signed)
ok 

## 2017-05-22 NOTE — Telephone Encounter (Signed)
Ok to refill 

## 2017-06-13 ENCOUNTER — Other Ambulatory Visit: Payer: Self-pay | Admitting: Family Medicine

## 2017-06-23 ENCOUNTER — Ambulatory Visit (INDEPENDENT_AMBULATORY_CARE_PROVIDER_SITE_OTHER): Payer: BLUE CROSS/BLUE SHIELD | Admitting: Family Medicine

## 2017-06-23 ENCOUNTER — Telehealth: Payer: Self-pay | Admitting: *Deleted

## 2017-06-23 ENCOUNTER — Other Ambulatory Visit: Payer: Self-pay

## 2017-06-23 ENCOUNTER — Encounter: Payer: Self-pay | Admitting: Family Medicine

## 2017-06-23 VITALS — BP 128/80 | HR 78 | Temp 98.5°F | Resp 16 | Ht 66.0 in | Wt 132.0 lb

## 2017-06-23 DIAGNOSIS — N393 Stress incontinence (female) (male): Secondary | ICD-10-CM | POA: Diagnosis not present

## 2017-06-23 DIAGNOSIS — Z1231 Encounter for screening mammogram for malignant neoplasm of breast: Secondary | ICD-10-CM | POA: Diagnosis not present

## 2017-06-23 DIAGNOSIS — Z1239 Encounter for other screening for malignant neoplasm of breast: Secondary | ICD-10-CM

## 2017-06-23 DIAGNOSIS — F331 Major depressive disorder, recurrent, moderate: Secondary | ICD-10-CM | POA: Diagnosis not present

## 2017-06-23 DIAGNOSIS — Z124 Encounter for screening for malignant neoplasm of cervix: Secondary | ICD-10-CM

## 2017-06-23 DIAGNOSIS — Z23 Encounter for immunization: Secondary | ICD-10-CM | POA: Diagnosis not present

## 2017-06-23 DIAGNOSIS — Z0000 Encounter for general adult medical examination without abnormal findings: Secondary | ICD-10-CM | POA: Diagnosis not present

## 2017-06-23 DIAGNOSIS — F411 Generalized anxiety disorder: Secondary | ICD-10-CM

## 2017-06-23 NOTE — Patient Instructions (Signed)
Urology referral I recommend eye visit once a year I recommend dental visit every 6 months Goal is to  Exercise 30 minutes 5 days a week We will call with lab results  Flu shot and shingles shot given  F/U 6 months

## 2017-06-23 NOTE — Telephone Encounter (Signed)
Received verbal orders for Cologuard.   Order placed via [facility].   Order [id number] has been submitted.

## 2017-06-23 NOTE — Progress Notes (Signed)
Subjective:    Patient ID: Kristy Butler, female    DOB: 1960-12-31, 57 y.o.   MRN: [id number]  Patient presents for CPE with PAP (is fasting)  Pt here for CPE. Medications reviewed  Due for PAP Smear - last 10 years ago, had tubes tied   Colonoscopy- Due , interested in colguard  Immunizations- TDAP less than 10 years  Discussed Hep C screening Mammogram Overdue for shingles, Flu shot,, pneumonia shot done  2014 Previous GYN- Dr. Annamaria Boots with cardiology    At our last visit in November, she as having increased depression/grief husband passed away and then 5 weeks later her brother had passed away.  She is taking Wellbutrin and Effexor also using lorazepam.  We discussed going to psychotherapy to help with the grief process.  Her PHQ 9 at that visit in November was 9 today down to 3 She never went to therapy. But feels much better  Urinary Incontince, would like to have bladder tact, - was on bladder medication. Would like to see urology If she sneezes, coughs, bend over  Review Of Systems:  GEN- denies fatigue, fever, weight loss,weakness, recent illness HEENT- denies eye drainage, change in vision, nasal discharge, CVS- denies chest pain, palpitations RESP- denies SOB, cough, wheeze ABD- denies N/V, change in stools, abd pain GU- denies dysuria, hematuria, dribbling, incontinence MSK- denies joint pain, muscle aches, injury Neuro- denies headache, dizziness, syncope, seizure activity       Objective:    BP 128/80   Pulse 78   Temp 98.5 F (36.9 C) (Oral)   Resp 16   Ht 5\' 6"  (1.676 m)   Wt 132 lb (59.9 kg)   SpO2 98%   BMI 21.31 kg/m  GEN- NAD, alert and oriented x3 HEENT- PERRL, EOMI, non injected sclera, pink conjunctiva, MMM, oropharynx clear Neck- Supple, no thyromegaly Breast- normal symmetry, no nipple inversion,no nipple drainage, no nodules or lumps felt Nodes- no axillary nodes CVS- RRR, no murmur RESP-CTAB ABD-NABS,soft,NT,ND GU- normal  external genitalia, vaginal mucosa pink and moist, cervix visualized no growth, no blood form os,Nodischarge, bladder prolapsed some no CMT, no ovarian masses, uterus normal size Rectum- normal tone FOBT neg Psych- normal affect and mood  EXT- No edema Pulses- Radial, DP- 2+        Assessment & Plan:      Problem List Items Addressed This Visit      Unprioritized   GAD (generalized anxiety disorder)   MDD (major depressive disorder)    Feels she is doing things much better.  We will continue her current regimen.       Other Visit Diagnoses    Routine general medical examination at a health care facility    -  Primary   CPE done, fasting labs, pt to schedule mammogram, cologuard for colon cancer screen. Urology for incontinence, shingles and flu shot given   Relevant Orders   CBC with Differential/Platelet   Comprehensive metabolic panel   Lipid panel   Hepatitis C antibody   Cervical cancer screening       Relevant Orders   Pap IG w/ reflex to HPV when ASC-U   Breast cancer screening       Relevant Orders   MM SCREENING BREAST TOMO BILATERAL   Stress incontinence of urine       Relevant Orders   Ambulatory referral to Urology      Note: This dictation was prepared with Dragon dictation along with smaller  Company secretary. Any transcriptional errors that result from this process are unintentional.

## 2017-06-23 NOTE — Assessment & Plan Note (Signed)
Feels she is doing things much better.  We will continue her current regimen.

## 2017-06-23 NOTE — Addendum Note (Signed)
Addended by: Sheral Flow on: 06/23/2017 11:24 AM   Modules accepted: Orders

## 2017-06-24 LAB — CBC WITH DIFFERENTIAL/PLATELET
Basophils Absolute: 72 cells/uL (ref 0–200)
Basophils Relative: 1.2 %
Eosinophils Absolute: 240 cells/uL (ref 15–500)
Eosinophils Relative: 4 %
HCT: 38.2 % (ref 35.0–45.0)
Hemoglobin: 12.9 g/dL (ref 11.7–15.5)
Lymphs Abs: 2952 cells/uL (ref 850–3900)
MCH: 31.4 pg (ref 27.0–33.0)
MCHC: 33.8 g/dL (ref 32.0–36.0)
MCV: 92.9 fL (ref 80.0–100.0)
MPV: 9.5 fL (ref 7.5–12.5)
Monocytes Relative: 6.4 %
Neutro Abs: 2352 cells/uL (ref 1500–7800)
Neutrophils Relative %: 39.2 %
Platelets: 259 10*3/uL (ref 140–400)
RBC: 4.11 10*6/uL (ref 3.80–5.10)
RDW: 12.8 % (ref 11.0–15.0)
Total Lymphocyte: 49.2 %
WBC mixed population: 384 cells/uL (ref 200–950)
WBC: 6 10*3/uL (ref 3.8–10.8)

## 2017-06-24 LAB — LIPID PANEL
Cholesterol: 213 mg/dL — ABNORMAL HIGH (ref <200)
HDL: 53 mg/dL (ref >50)
LDL Cholesterol (Calc): 138 mg/dL (calc) — ABNORMAL HIGH
Non-HDL Cholesterol (Calc): 160 mg/dL (calc) — ABNORMAL HIGH (ref <130)
Total CHOL/HDL Ratio: 4 (calc) (ref <5.0)
Triglycerides: 103 mg/dL (ref <150)

## 2017-06-24 LAB — COMPREHENSIVE METABOLIC PANEL
AG Ratio: 1.8 (calc) (ref 1.0–2.5)
ALT: 11 U/L (ref 6–29)
AST: 16 U/L (ref 10–35)
Albumin: 4.2 g/dL (ref 3.6–5.1)
Alkaline phosphatase (APISO): 80 U/L (ref 33–130)
BUN: 12 mg/dL (ref 7–25)
CO2: 27 mmol/L (ref 20–32)
Calcium: 8.9 mg/dL (ref 8.6–10.4)
Chloride: 104 mmol/L (ref 98–110)
Creat: 0.82 mg/dL (ref 0.50–1.05)
Globulin: 2.3 g/dL (calc) (ref 1.9–3.7)
Glucose, Bld: 92 mg/dL (ref 65–99)
Potassium: 4.2 mmol/L (ref 3.5–5.3)
Sodium: 140 mmol/L (ref 135–146)
Total Bilirubin: 0.3 mg/dL (ref 0.2–1.2)
Total Protein: 6.5 g/dL (ref 6.1–8.1)

## 2017-06-24 LAB — HEPATITIS C ANTIBODY
Hepatitis C Ab: NONREACTIVE
SIGNAL TO CUT-OFF: 0.01 (ref <1.00)

## 2017-06-26 ENCOUNTER — Other Ambulatory Visit: Payer: Self-pay | Admitting: Family Medicine

## 2017-06-26 DIAGNOSIS — Z1231 Encounter for screening mammogram for malignant neoplasm of breast: Secondary | ICD-10-CM

## 2017-06-27 LAB — PAP IG W/ RFLX HPV ASCU

## 2017-06-29 ENCOUNTER — Encounter: Payer: Self-pay | Admitting: *Deleted

## 2017-07-12 ENCOUNTER — Ambulatory Visit: Payer: BLUE CROSS/BLUE SHIELD

## 2017-07-26 ENCOUNTER — Other Ambulatory Visit: Payer: Self-pay | Admitting: Family Medicine

## 2017-07-27 ENCOUNTER — Ambulatory Visit
Admission: RE | Admit: 2017-07-27 | Discharge: 2017-07-27 | Disposition: A | Discharge status: Home / Self Care | Payer: BLUE CROSS/BLUE SHIELD | Source: Ambulatory Visit | Attending: Family Medicine | Admitting: Family Medicine

## 2017-07-27 DIAGNOSIS — Z1231 Encounter for screening mammogram for malignant neoplasm of breast: Secondary | ICD-10-CM

## 2017-08-03 LAB — COLOGUARD: Cologuard: NEGATIVE

## 2017-08-14 ENCOUNTER — Encounter: Payer: Self-pay | Admitting: *Deleted

## 2017-08-14 NOTE — Telephone Encounter (Signed)
Received Cologuard results.   Noted Negative.

## 2017-08-29 ENCOUNTER — Other Ambulatory Visit: Payer: Self-pay | Admitting: Family Medicine

## 2017-12-22 ENCOUNTER — Ambulatory Visit: Payer: 59 | Admitting: Family Medicine

## 2018-01-15 ENCOUNTER — Other Ambulatory Visit: Payer: Self-pay | Admitting: Family Medicine

## 2018-01-22 ENCOUNTER — Ambulatory Visit (INDEPENDENT_AMBULATORY_CARE_PROVIDER_SITE_OTHER): Payer: Self-pay | Admitting: Family Medicine

## 2018-01-22 ENCOUNTER — Other Ambulatory Visit: Payer: Self-pay

## 2018-01-22 ENCOUNTER — Encounter: Payer: Self-pay | Admitting: Family Medicine

## 2018-01-22 VITALS — BP 124/66 | HR 82 | Temp 98.1°F | Resp 14 | Ht 66.0 in | Wt 133.0 lb

## 2018-01-22 DIAGNOSIS — G43009 Migraine without aura, not intractable, without status migrainosus: Secondary | ICD-10-CM

## 2018-01-22 DIAGNOSIS — F331 Major depressive disorder, recurrent, moderate: Secondary | ICD-10-CM

## 2018-01-22 DIAGNOSIS — G43809 Other migraine, not intractable, without status migrainosus: Secondary | ICD-10-CM

## 2018-01-22 DIAGNOSIS — F17200 Nicotine dependence, unspecified, uncomplicated: Secondary | ICD-10-CM

## 2018-01-22 DIAGNOSIS — F411 Generalized anxiety disorder: Secondary | ICD-10-CM

## 2018-01-22 MED ORDER — BUPROPION HCL ER (SR) 150 MG PO TB12: 150.0000 mg | ORAL_TABLET | Freq: Two times a day (BID) | ORAL | 6 refills | Status: DC

## 2018-01-22 MED ORDER — CYCLOBENZAPRINE HCL 5 MG PO TABS: 5.0000 mg | ORAL_TABLET | Freq: Three times a day (TID) | ORAL | 1 refills | Status: DC | PRN

## 2018-01-22 MED ORDER — RIZATRIPTAN BENZOATE 10 MG PO TBDP: ORAL_TABLET | ORAL | 2 refills | Status: DC

## 2018-01-22 NOTE — Assessment & Plan Note (Signed)
Continues to cut back on her tobacco use.

## 2018-01-22 NOTE — Progress Notes (Signed)
   Subjective:    Patient ID: Kristy Butler, female    DOB: 03/16/1961, 57 y.o.   MRN: [id number]  Patient presents for Follow-up (is not fasting)   Pt here  To f/u medications, states that she is doing very well.  States that she has felt good her stress levels have decreased.  She is taking the Wellbutrin but she taper herself off the Effexor and she also uses the Ativan but very sparingly.  She has not requested a refill on this this year.  Migraines these have been better controlled.  She does keep Maxalt on hand.  She also uses Flexeril for muscle spasms as needed.  She is still working part-time.  She is now getting some help with renovating her home from the [employer] which has been very helpful to her secondary to finances.  He does not have any new concerns today.    TObacco use- she has decreased cig to 5-6 a day  She does go to church and has a good support system there.  Review Of Systems:  GEN- denies fatigue, fever, weight loss,weakness, recent illness HEENT- denies eye drainage, change in vision, nasal discharge, CVS- denies chest pain, palpitations RESP- denies SOB, cough, wheeze ABD- denies N/V, change in stools, abd pain GU- denies dysuria, hematuria, dribbling, incontinence MSK- denies joint pain, muscle aches, injury Neuro- denies headache, dizziness, syncope, seizure activity       Objective:    BP 124/66   Pulse 82   Temp 98.1 F (36.7 C) (Oral)   Resp 14   Ht 5\' 6"  (1.676 m)   Wt 133 lb (60.3 kg)   SpO2 98%   BMI 21.47 kg/m  GEN- NAD, alert and oriented x3 HEENT- PERRL, EOMI, non injected sclera, pink conjunctiva, MMM, oropharynx clear Neck- Supple, no thyromegaly CVS- RRR, no murmur RESP-CTAB Psych- Normal affect and mood EXT- No edema Pulses- Radial 2+        Assessment & Plan:      Problem List Items Addressed This Visit      Unprioritized   GAD (generalized anxiety disorder)   Relevant Medications   buPROPion (WELLBUTRIN  SR) 150 MG 12 hr tablet   MDD (major depressive disorder) - Primary    Overall doing well with the Wellbutrin rare use of the diazepam for her anxiety.  No change to her medications.      Relevant Medications   buPROPion (WELLBUTRIN SR) 150 MG 12 hr tablet   Migraines    Continue her as needed medication for her migraines these have also improved as her stress levels have improved      Relevant Medications   rizatriptan (MAXALT-MLT) 10 MG disintegrating tablet   buPROPion (WELLBUTRIN SR) 150 MG 12 hr tablet   cyclobenzaprine (FLEXERIL) 5 MG tablet   Tobacco use disorder    Continues to cut back on her tobacco use.         Note: This dictation was prepared with Dragon dictation along with smaller phrase technology. Any transcriptional errors that result from this process are unintentional.

## 2018-01-22 NOTE — Assessment & Plan Note (Addendum)
Overall doing well with the Wellbutrin rare use of the diazepam for her anxiety.  No change to her medications.

## 2018-01-22 NOTE — Assessment & Plan Note (Signed)
Continue her as needed medication for her migraines these have also improved as her stress levels have improved

## 2018-01-22 NOTE — Patient Instructions (Signed)
F/U 6 months  Physical  Continue current medications

## 2018-05-25 IMAGING — NM NM MISC PROCEDURE
6 series · 36 of 36 positions shown · non-contrast
Comparison: none

[Series 1: rest · 6.51mm/px · 6 of 64 frames shown]
[frame 6/64]
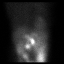
[frame 16/64]
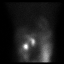
[frame 27/64]
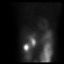
[frame 38/64]
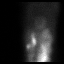
[frame 48/64]
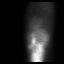
[frame 59/64]
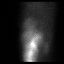

[Series 1: wbr_r-proj_st rest · 6.51mm/px · 6 of 64 frames shown]
[frame 6/64]
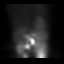
[frame 16/64]
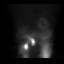
[frame 27/64]
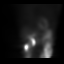
[frame 38/64]
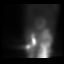
[frame 48/64]
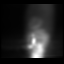
[frame 59/64]
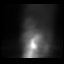

[Series 2: stress · 6.51mm/px · 6 of 512 frames shown (1 of 2)]
[frame 43/512]
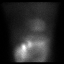
[frame 128/512]
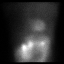
[frame 214/512]
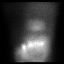
[frame 299/512]
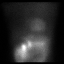
[frame 384/512]
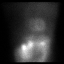
[frame 470/512]
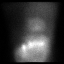

[Series 2: wbr_s-proj_st stress · 6.51mm/px · 6 of 64 frames shown (1 of 2)]
[frame 6/64]
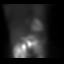
[frame 16/64]
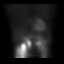
[frame 27/64]
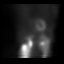
[frame 38/64]
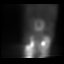
[frame 48/64]
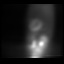
[frame 59/64]
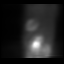

[Series 2: wbr_s-proj_st stress · 6.51mm/px · 6 of 512 frames shown (2 of 2)]
[frame 43/512]
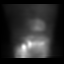
[frame 128/512]
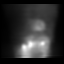
[frame 214/512]
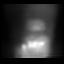
[frame 299/512]
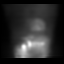
[frame 384/512]
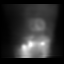
[frame 470/512]
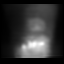

[Series 2: stress · 6.51mm/px · 6 of 64 frames shown (2 of 2)]
[frame 6/64]
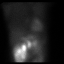
[frame 16/64]
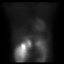
[frame 27/64]
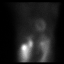
[frame 38/64]
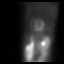
[frame 48/64]
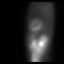
[frame 59/64]
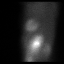

[36 of 36 positions shown; findings below may reference images not displayed]

Canned report from images found in remote index.

Refer to host system for actual result text.

## 2018-08-01 ENCOUNTER — Encounter: Payer: Self-pay | Admitting: Family Medicine

## 2018-08-01 ENCOUNTER — Ambulatory Visit: Payer: Self-pay | Admitting: Family Medicine

## 2018-08-01 ENCOUNTER — Ambulatory Visit: Payer: BLUE CROSS/BLUE SHIELD | Admitting: Family Medicine

## 2018-08-01 VITALS — BP 120/80 | HR 72 | Temp 98.8°F | Resp 16 | Ht 66.0 in | Wt 134.1 lb

## 2018-08-01 DIAGNOSIS — M545 Low back pain, unspecified: Secondary | ICD-10-CM

## 2018-08-01 DIAGNOSIS — M25552 Pain in left hip: Secondary | ICD-10-CM

## 2018-08-01 MED ORDER — PREDNISONE 20 MG PO TABS: ORAL_TABLET | ORAL | 0 refills | Status: DC

## 2018-08-01 MED ORDER — KETOROLAC TROMETHAMINE 30 MG/ML IJ SOLN
30.0000 mg | Freq: Once | INTRAMUSCULAR | Status: AC
  Administered 2018-08-01: 30 mg via INTRAMUSCULAR

## 2018-08-01 MED ORDER — CYCLOBENZAPRINE HCL 5 MG PO TABS: 5.0000 mg | ORAL_TABLET | Freq: Three times a day (TID) | ORAL | 1 refills | Status: DC | PRN

## 2018-08-01 MED ORDER — METHYLPREDNISOLONE ACETATE 40 MG/ML IJ SUSP
40.0000 mg | Freq: Once | INTRAMUSCULAR | Status: AC
  Administered 2018-08-01: 40 mg via INTRAMUSCULAR

## 2018-08-01 NOTE — Progress Notes (Signed)
Patient ID: Kristy Butler, female    DOB: [birth year], 58 y.o.   MRN: [id number]  PCP: Alycia Rossetti, MD  Chief Complaint  Patient presents with  . Hip Pain    Patient in with c/o left hip pain. Onset last week.     Subjective:   Kristy Butler is a 58 y.o. female, presents to clinic with CC of left hip pain, sudden onset last week, associated with loud painful "pop", she was standing and she twisted her body, she felt a pop and had pain to left hip. Since then she's had constant pain to left hip, at first radiated to her groin, then radiated down the outside of her left outer thigh to left outer knee and also pain radiates to left buttock and low back.  Pain moderate to severe, described "like a tooth ache", dull, tender. Associated left thigh numbness feels like she "has novocaine right there."  Pain worse with standing or weightbearing, becomes unbearable and severe after about 1 hour of standing, slightly better with shifting weight to right leg.  Pain also alleviated with propping left leg up (hip flexed > 90 degrees) and also better with  laying down.  No change with sitting or driving.   There is not more popping or clicking, she denies swelling, redness, weakness.  She's tried flexeril -she only had 2 pills remaining and although she feels like it just put her to sleep but did help with symptoms in the morning it was easier for her to get out of bed with less pain located to the low left back.  She denies any muscle spasms.   She denies any story of back pain strain, degenerative disc disease.  Denies any long-term steroid use, PPI use, history of osteoporosis osteopenia.  Denies any abdominal pain, urinary, vaginal or bowel symptoms, denies any incontinence of urine or stool, denies any fever chills sweats nausea vomiting.    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  Patient Active Problem List   Diagnosis Date Noted  . Tobacco use disorder 01/22/2018  . Migraines 04/24/2017  . GAD (generalized anxiety disorder) 04/24/2017  . Abnormal stress test   . MDD (major depressive disorder) 09/29/2015     Prior to Admission medications   Medication Sig Start Date End Date Taking? Authorizing Provider  buPROPion (WELLBUTRIN SR) 150 MG 12 hr tablet Take 1 tablet (150 mg total) by mouth 2 (two) times daily. 01/22/18  Yes , Modena Nunnery, MD  LORazepam (ATIVAN) 0.5 MG tablet TAKE 1 TABLET BY MOUTH THREE TIMES DAILY AS NEEDED MUST LAST 30 DAYS 05/22/17  Yes Susy Frizzle, MD  cyclobenzaprine (FLEXERIL) 5 MG tablet Take 1 tablet (5 mg total) by mouth 3 (three) times daily as needed for muscle spasms. Patient not taking: Reported on 08/01/2018 01/22/18   Alycia Rossetti, MD  rizatriptan (MAXALT-MLT) 10 MG disintegrating tablet TAKE 1 TABLET BY MOUTH AS NEEDED FOR MIGRAINE. MAY REPEAT IN 2 HOURS IF NEEDED. Patient not taking: Reported on 08/01/2018 01/22/18   Alycia Rossetti, MD     Allergies  Allergen Reactions  . Sulfur Hives, Itching and Swelling    - sulfa based antibiotic eye drop      Family History  Problem Relation Age of Onset  . Heart disease Mother   .  Hyperlipidemia Mother   . Hypertension Mother   . Hypertension Father   . AAA (abdominal aortic aneurysm) Brother   . Diabetes Brother   . Depression Brother   . Healthy Brother      Social History   Socioeconomic History  . Marital status: Divorced    Spouse name: Not on file  . Number of children: Not on file  . Years of education: Not on file  . Highest education level: Not on file  Occupational History  . Not on file  Social Needs  . Financial resource strain: Not on file  . Food insecurity:    Worry: Not on file    Inability: Not on file  . Transportation needs:    Medical: Not on file    Non-medical: Not on file  Tobacco Use  . Smoking status: Current  Every Day Smoker    Packs/day: 0.50    Years: 20.00    Pack years: 10.00    Types: Cigarettes  . Smokeless tobacco: Never Used  Substance and Sexual Activity  . Alcohol use: No  . Drug use: No  . Sexual activity: Yes  Lifestyle  . Physical activity:    Days per week: Not on file    Minutes per session: Not on file  . Stress: Not on file  Relationships  . Social connections:    Talks on phone: Not on file    Gets together: Not on file    Attends religious service: Not on file    Active member of club or organization: Not on file    Attends meetings of clubs or organizations: Not on file    Relationship status: Not on file  . Intimate partner violence:    Fear of current or ex partner: Not on file    Emotionally abused: Not on file    Physically abused: Not on file    Forced sexual activity: Not on file  Other Topics Concern  . Not on file  Social History Narrative  . Not on file     Review of Systems  Constitutional: Negative.   HENT: Negative.   Eyes: Negative.   Respiratory: Negative.   Cardiovascular: Negative.   Gastrointestinal: Negative.   Endocrine: Negative.   Genitourinary: Negative.   Musculoskeletal: Negative.   Skin: Negative.   Allergic/Immunologic: Negative.   Neurological: Negative.   Hematological: Negative.   Psychiatric/Behavioral: Negative.   All other systems reviewed and are negative.      Objective:    Vitals:   08/01/18 1052  BP: 120/80  Pulse: 72  Resp: 16  Temp: 98.8 F (37.1 C)  TempSrc: Oral  SpO2: 98%  Weight: 134 lb 2 oz (60.8 kg)  Height: 5\' 6"  (1.676 m)      Physical Exam Vitals signs and nursing note reviewed.  Constitutional:      General: She is not in acute distress.    Appearance: She is well-developed and normal weight. She is not toxic-appearing or diaphoretic.  HENT:     Head: Normocephalic and atraumatic.     Right Ear: External ear normal.     Left Ear: External ear normal.     Nose: Nose normal.       Mouth/Throat:     Mouth: Mucous membranes are moist.  Eyes:     General:        Right eye: No discharge.        Left eye: No discharge.     Conjunctiva/sclera: Conjunctivae  normal.  Neck:     Musculoskeletal: Normal range of motion. Normal range of motion. No neck rigidity, crepitus, pain with movement, spinous process tenderness or muscular tenderness.     Trachea: Trachea and phonation normal. No tracheal deviation.  Cardiovascular:     Rate and Rhythm: Normal rate and regular rhythm.     Pulses: Normal pulses.     Heart sounds: Normal heart sounds.  Pulmonary:     Effort: Pulmonary effort is normal. No respiratory distress.     Breath sounds: Normal breath sounds. No stridor.  Abdominal:     General: Abdomen is flat. Bowel sounds are normal. There is no distension.     Tenderness: There is no abdominal tenderness. There is no right CVA tenderness or left CVA tenderness.  Musculoskeletal:        General: Tenderness present. No swelling or deformity.     Left hip: She exhibits tenderness. She exhibits normal range of motion, normal strength, no bony tenderness, no swelling and no crepitus.     Left knee: She exhibits normal range of motion, no swelling and no effusion. No tenderness found.     Cervical back: Normal.     Thoracic back: Normal.     Lumbar back: She exhibits tenderness. She exhibits normal range of motion, no bony tenderness, no swelling, no edema, no deformity, no pain and no spasm.     Right lower leg: No edema.     Left lower leg: No edema.     Comments: ttp to left SI joint, left hip over greater trochanter area Left hip Normal ROM, strength, and sensation to RLE No crepitus to left hip Internal and external rotation normal Negative SLR b/l No difficulty with positional changes from sitting in chair to standing to getting up on exam table and then laying down and getting back up   Skin:    General: Skin is warm and dry.     Capillary Refill: Capillary  refill takes less than 2 seconds.     Findings: No erythema or rash.  Neurological:     Mental Status: She is alert.     Sensory: No sensory deficit.     Motor: No weakness or abnormal muscle tone.     Coordination: Coordination normal.     Comments: Antalgic gait  Psychiatric:        Mood and Affect: Mood normal.        Behavior: Behavior normal.           Assessment & Plan:      ICD-10-CM   1. Left hip pain M25.552 DG HIP UNILAT WITH PELVIS 2-3 VIEWS LEFT    ketorolac (TORADOL) 30 MG/ML injection 30 mg    methylPREDNISolone acetate (DEPO-MEDROL) injection 40 mg   hip pathology?  some bursitis sx as well, xray, rest, antiinflammatories, may need ortho is pain with weight bearing continues  2. Acute left-sided low back pain without sciatica M54.5 ketorolac (TORADOL) 30 MG/ML injection 30 mg    methylPREDNISolone acetate (DEPO-MEDROL) injection 40 mg   pain to low left back and SI joint area, no sciatica - stretches, NSAIDs, heat therapy - PT may be helpful    For back recommend PT if pain continues For hip Xrays first - pt doesn't seem to want to get Excuse from work today, encouraged to get xrays done today, rest for a few days If weight bearing continues to be painful recommend Ortho referral - would not wait more than a  week Concern for some hip pathology - bursitis vs OA to hip vs labral issue - r/o osseous pathology today with xray  Discussed using steroids and NSAIDs at different times to avoid gastritis - pt verbalized understanding    Delsa Grana, PA-C 08/01/18 11:07 AM

## 2018-08-01 NOTE — Patient Instructions (Signed)
Go get your x-ray as soon as you can.  Rest but do not completely immobilize yourself for long periods of time, it will make the low back and buttock pain worse.  Can use flexeril as needed for stiffness or muscle spasms  Take steroids starting tomorrow  Can use tylenol and NSAIDs over the counter (take NSAIDs like naproxen or aleve at a different time than steroids because it can be hard on your stomach)  CALL ME EARLY NEXT WEEK ON Monday OR Tuesday IF YOU ARE NOT FEELING BETTER AND WE WILL PUT IN A REFERRAL TO ORTHO FOR YOUR HIP PAIN.   Hip Pain  The hip is the joint between the upper legs and the lower pelvis. The bones, cartilage, tendons, and muscles of your hip joint support your body and allow you to move around. Hip pain can range from a minor ache to severe pain in one or both of your hips. The pain may be felt on the inside of the hip joint near the groin, or the outside near the buttocks and upper thigh. You may also have swelling or stiffness. Follow these instructions at home: Managing pain, stiffness, and swelling  If directed, apply ice to the injured area. ? Put ice in a plastic bag. ? Place a towel between your skin and the bag. ? Leave the ice on for 20 minutes, 2-3 times a day  Sleep with a pillow between your legs on your most comfortable side.  Avoid any activities that cause pain. General instructions  Take over-the-counter and prescription medicines only as told by your health care provider.  Do any exercises as told by your health care provider.  Record the following: ? How often you have hip pain. ? The location of your pain. ? What the pain feels like. ? What makes the pain worse.  Keep all follow-up visits as told by your health care provider. This is important. Contact a health care provider if:  You cannot put weight on your leg.  Your pain or swelling continues or gets worse after one week.  It gets harder to walk.  You have a fever. Get  help right away if:  You fall.  You have a sudden increase in pain and swelling in your hip.  Your hip is red or swollen or very tender to touch. Summary  Hip pain can range from a minor ache to severe pain in one or both of your hips.  The pain may be felt on the inside of the hip joint near the groin, or the outside near the buttocks and upper thigh.  Avoid any activities that cause pain.  Record how often you have hip pain, the location of the pain, what makes it worse and what it feels like. This information is not intended to replace advice given to you by your health care provider. Make sure you discuss any questions you have with your health care provider. Document Released: 11/10/2009 Document Revised: 04/25/2016 Document Reviewed: 04/25/2016 Elsevier Interactive Patient Education  2019 Elsevier Inc.   Acute Back Pain, Adult Acute back pain is sudden and usually short-lived. It is often caused by an injury to the muscles and tissues in the back. The injury may result from:  A muscle or ligament getting overstretched or torn (strained). Ligaments are tissues that connect bones to each other. Lifting something improperly can cause a back strain.  Wear and tear (degeneration) of the spinal disks. Spinal disks are circular tissue that provides cushioning  between the bones of the spine (vertebrae).  Twisting motions, such as while playing sports or doing yard work.  A hit to the back.  Arthritis. You may have a physical exam, lab tests, and imaging tests to find the cause of your pain. Acute back pain usually goes away with rest and home care. Follow these instructions at home: Managing pain, stiffness, and swelling  Take over-the-counter and prescription medicines only as told by your health care provider.  Your health care provider may recommend applying ice during the first 24-48 hours after your pain starts. To do this: ? Put ice in a plastic bag. ? Place a towel  between your skin and the bag. ? Leave the ice on for 20 minutes, 2-3 times a day.  If directed, apply heat to the affected area as often as told by your health care provider. Use the heat source that your health care provider recommends, such as a moist heat pack or a heating pad. ? Place a towel between your skin and the heat source. ? Leave the heat on for 20-30 minutes. ? Remove the heat if your skin turns bright red. This is especially important if you are unable to feel pain, heat, or cold. You have a greater risk of getting burned. Activity   Do not stay in bed. Staying in bed for more than 1-2 days can delay your recovery.  Sit up and stand up straight. Avoid leaning forward when you sit, or hunching over when you stand. ? If you work at a desk, sit close to it so you do not need to lean over. Keep your chin tucked in. Keep your neck drawn back, and keep your elbows bent at a right angle. Your arms should look like the letter "L." ? Sit high and close to the steering wheel when you drive. Add lower back (lumbar) support to your car seat, if needed.  Take short walks on even surfaces as soon as you are able. Try to increase the length of time you walk each day.  Do not sit, drive, or stand in one place for more than 30 minutes at a time. Sitting or standing for long periods of time can put stress on your back.  Do not drive or use heavy machinery while taking prescription pain medicine.  Use proper lifting techniques. When you bend and lift, use positions that put less stress on your back: ? [place] your knees. ? Keep the load close to your body. ? Avoid twisting.  Exercise regularly as told by your health care provider. Exercising helps your back heal faster and helps prevent back injuries by keeping muscles strong and flexible.  Work with a physical therapist to make a safe exercise program, as recommended by your health care provider. Do any exercises as told by your physical  therapist. Lifestyle  Maintain a healthy weight. Extra weight puts stress on your back and makes it difficult to have good posture.  Avoid activities or situations that make you feel anxious or stressed. Stress and anxiety increase muscle tension and can make back pain worse. Learn ways to manage anxiety and stress, such as through exercise. General instructions  Sleep on a firm mattress in a comfortable position. Try lying on your side with your knees slightly bent. If you lie on your back, put a pillow under your knees.  Follow your treatment plan as told by your health care provider. This may include: ? Cognitive or behavioral therapy. ? Acupuncture or  massage therapy. ? Meditation or yoga. Contact a health care provider if:  You have pain that is not relieved with rest or medicine.  You have increasing pain going down into your legs or buttocks.  Your pain does not improve after 2 weeks.  You have pain at night.  You lose weight without trying.  You have a fever or chills. Get help right away if:  You develop new bowel or bladder control problems.  You have unusual weakness or numbness in your arms or legs.  You develop nausea or vomiting.  You develop abdominal pain.  You feel faint. Summary  Acute back pain is sudden and usually short-lived.  Use proper lifting techniques. When you bend and lift, use positions that put less stress on your back.  Take over-the-counter and prescription medicines and apply heat or ice as directed by your health care provider. This information is not intended to replace advice given to you by your health care provider. Make sure you discuss any questions you have with your health care provider. Document Released: 05/23/2005 Document Revised: 12/28/2017 Document Reviewed: 01/04/2017 Elsevier Interactive Patient Education  2019 Elsevier Inc.    Hip Bursitis  Hip bursitis is swelling of a fluid-filled sac (bursa) in your hip. This  swelling (inflammation) can be painful. This condition may come and go over time. Follow these instructions at home: Medicines  Take over-the-counter and prescription medicines only as told by your doctor.  Do not drive or use heavy machinery while taking prescription pain medicine, or as told by your doctor.  If you were prescribed an antibiotic medicine, take it as told by your doctor. Do not stop taking the antibiotic even if you start to feel better. Activity  Return to your normal activities as told by your doctor. Ask your doctor what activities are safe for you.  Rest and protect your hip until you feel better. General instructions  Wear wraps that put pressure on your hip (compression wraps) only as told by your doctor.  Raise (elevate) your hip above the level of your heart as much as you can. To do this, try putting a pillow under your hips while you lie down. Stop if this causes pain.  Do not use your hip to support your body weight until your doctor says that you can.  Use crutches as told by your doctor.  Gently rub and stretch your injured area as often as is comfortable.  Keep all follow-up visits as told by your doctor. This is important. How is this prevented?  Exercise regularly, as told by your doctor.  Warm up and stretch before being active.  Cool down and stretch after being active.  Avoid activities that bother your hip or cause pain.  Avoid sitting down for long periods at a time. Contact a doctor if:  You have a fever.  You get new symptoms.  You have trouble walking.  You have trouble doing everyday activities.  You have pain that gets worse.  You have pain that does not get better with medicine.  You get red skin on your hip area.  You get a feeling of warmth in your hip area. Get help right away if:  You cannot move your hip.  You have very bad pain. This information is not intended to replace advice given to you by your health  care provider. Make sure you discuss any questions you have with your health care provider. Document Released: 06/25/2010 Document Revised: 10/29/2015 Document Reviewed:  12/23/2014 Elsevier Interactive Patient Education  [employer].

## 2018-09-12 ENCOUNTER — Other Ambulatory Visit: Payer: Self-pay | Admitting: *Deleted

## 2018-09-12 MED ORDER — LORAZEPAM 0.5 MG PO TABS: ORAL_TABLET | ORAL | 1 refills | Status: DC

## 2018-09-12 NOTE — Telephone Encounter (Signed)
Received fax requesting refill on Ativan.   Ok to refill??  Last office visit 08/01/2018.  Last refill 05/22/2017, #1 refill.

## 2018-11-21 ENCOUNTER — Other Ambulatory Visit: Payer: Self-pay

## 2018-11-21 ENCOUNTER — Encounter: Payer: Self-pay | Admitting: Family Medicine

## 2018-11-21 ENCOUNTER — Ambulatory Visit: Payer: BLUE CROSS/BLUE SHIELD | Admitting: Family Medicine

## 2018-11-21 DIAGNOSIS — F331 Major depressive disorder, recurrent, moderate: Secondary | ICD-10-CM | POA: Diagnosis not present

## 2018-11-21 DIAGNOSIS — L709 Acne, unspecified: Secondary | ICD-10-CM | POA: Insufficient documentation

## 2018-11-21 DIAGNOSIS — G43009 Migraine without aura, not intractable, without status migrainosus: Secondary | ICD-10-CM | POA: Diagnosis not present

## 2018-11-21 DIAGNOSIS — N3946 Mixed incontinence: Secondary | ICD-10-CM

## 2018-11-21 MED ORDER — MYRBETRIQ 50 MG PO TB24: ORAL_TABLET | ORAL | 6 refills | Status: DC

## 2018-11-21 MED ORDER — CYCLOBENZAPRINE HCL 5 MG PO TABS: 5.0000 mg | ORAL_TABLET | Freq: Three times a day (TID) | ORAL | 1 refills | Status: DC | PRN

## 2018-11-21 MED ORDER — BUPROPION HCL ER (SR) 150 MG PO TB12: 150.0000 mg | ORAL_TABLET | Freq: Two times a day (BID) | ORAL | 6 refills | Status: DC

## 2018-11-21 MED ORDER — TRETINOIN MICROSPHERE 0.1 % EX GEL: Freq: Every day | CUTANEOUS | 3 refills | Status: DC

## 2018-11-21 NOTE — Assessment & Plan Note (Signed)
Will refill myrbetriq

## 2018-11-21 NOTE — Patient Instructions (Signed)
F/U 6 months for Physical  

## 2018-11-21 NOTE — Assessment & Plan Note (Signed)
End of visit requested refill and change back to her old dose of retin A gel

## 2018-11-21 NOTE — Progress Notes (Signed)
   Subjective:    Patient ID: Kristy Butler, female    DOB: 1961/05/24, 58 y.o.   MRN: [id number]  Patient presents for Medication Review/ Refill (has been seen by urology and was prescribed Myrbetriq, would like PCP to refill as she can't get into urology office)   Seen by urology 1 year ago given  myrbetriq for mixed urinary incontience, this has worked well, unable to get into urology right now.    Has bite on innner right ankle    MDD- she is doing well on the wellbutrin for her nerves, occ gets anxious, she is working full time since her husband died, sometimes empolyees at work get on her nerves. 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 [employer] Has to stand 8-10 hours shifts  She did see Art therapist which helped   Review Of Systems:  GEN- denies fatigue, fever, weight loss,weakness, recent illness HEENT- denies eye drainage, change in vision, nasal discharge, CVS- denies chest pain, palpitations RESP- denies SOB, cough, wheeze ABD- denies N/V, change in stools, abd pain GU- denies dysuria, hematuria, dribbling, incontinence MSK- + joint pain, muscle aches, injury Neuro- denies headache, dizziness, syncope, seizure activity       Objective:    BP 134/82   Pulse 100   Temp 99.3 F (37.4 C) (Oral)   Resp 14   Ht 5\' 6"  (1.676 m)   Wt 130 lb (59 kg)   SpO2 97%   BMI 20.98 kg/m  GEN- NAD, alert and oriented x3 HEENT- PERRL, EOMI, non injected sclera, pink conjunctiva, MMM, oropharynx clear Neck- Supple, no thyromegaly CVS- RRR HR 90, no murmur RESP-CTAB ABD-NABS,soft,NT,ND EXT- No edema Pulses- Radial, DP- 2+        Assessment & Plan:      Problem List Items Addressed This Visit      Unprioritized   Adult acne    End of visit requested refill and change back to her old  dose of retin A gel      Relevant Medications   tretinoin microspheres (RETIN-A MICRO) 0.1 % gel   MDD (major depressive disorder)    Overall doing well on wellbutrin and ativan Working and living on her own has been a big adjustment for her since her husband died      Relevant Medications   buPROPion (WELLBUTRIN SR) 150 MG 12 hr tablet   Migraines    Rare headaches      Relevant Medications   buPROPion (WELLBUTRIN SR) 150 MG 12 hr tablet   cyclobenzaprine (FLEXERIL) 5 MG tablet   Mixed incontinence    Will refill myrbetriq      Relevant Medications   MYRBETRIQ 50 MG TB24 tablet      Note: This dictation was prepared with Dragon dictation along with smaller Company secretary. Any transcriptional errors that result from this process are unintentional.

## 2018-11-21 NOTE — Assessment & Plan Note (Signed)
Overall doing well on wellbutrin and ativan Working and living on her own has been a big adjustment for her since her husband died

## 2018-11-21 NOTE — Assessment & Plan Note (Signed)
Rare headaches

## 2018-11-26 ENCOUNTER — Telehealth: Payer: Self-pay | Admitting: *Deleted

## 2018-11-26 NOTE — Telephone Encounter (Signed)
Received request from pharmacy for PA on Retin-A.  PA submitted. Dx: L70.9- Acne.  Received immediate determination.   PA [id number] approved 10/27/2018- 11/26/2021.  Pharmacy made aware.

## 2019-03-06 ENCOUNTER — Other Ambulatory Visit: Payer: Self-pay | Admitting: Family Medicine

## 2019-03-06 NOTE — Telephone Encounter (Signed)
Ok to refill??  Last office visit 11/21/2018.  Last refill 09/12/2018, #1 refill.

## 2019-04-08 ENCOUNTER — Other Ambulatory Visit: Payer: Self-pay

## 2019-04-08 DIAGNOSIS — Z20822 Contact with and (suspected) exposure to covid-19: Secondary | ICD-10-CM

## 2019-04-09 LAB — NOVEL CORONAVIRUS, NAA: SARS-CoV-2, NAA: NOT DETECTED

## 2019-05-13 ENCOUNTER — Ambulatory Visit: Payer: BC Managed Care – PPO | Admitting: Family Medicine

## 2019-05-14 ENCOUNTER — Encounter: Payer: Self-pay | Admitting: Family Medicine

## 2019-05-14 ENCOUNTER — Ambulatory Visit: Payer: BC Managed Care – PPO | Admitting: Family Medicine

## 2019-05-14 ENCOUNTER — Other Ambulatory Visit: Payer: Self-pay

## 2019-05-14 VITALS — BP 134/68 | HR 68 | Temp 98.8°F | Resp 14 | Ht 66.0 in | Wt 128.0 lb

## 2019-05-14 DIAGNOSIS — R5383 Other fatigue: Secondary | ICD-10-CM

## 2019-05-14 DIAGNOSIS — M8580 Other specified disorders of bone density and structure, unspecified site: Secondary | ICD-10-CM | POA: Diagnosis not present

## 2019-05-14 DIAGNOSIS — Z1231 Encounter for screening mammogram for malignant neoplasm of breast: Secondary | ICD-10-CM

## 2019-05-14 DIAGNOSIS — M159 Polyosteoarthritis, unspecified: Secondary | ICD-10-CM

## 2019-05-14 DIAGNOSIS — E785 Hyperlipidemia, unspecified: Secondary | ICD-10-CM | POA: Diagnosis not present

## 2019-05-14 DIAGNOSIS — F331 Major depressive disorder, recurrent, moderate: Secondary | ICD-10-CM

## 2019-05-14 DIAGNOSIS — Z23 Encounter for immunization: Secondary | ICD-10-CM

## 2019-05-14 MED ORDER — MELOXICAM 15 MG PO TABS: 15.0000 mg | ORAL_TABLET | Freq: Every day | ORAL | 6 refills | Status: DC

## 2019-05-14 NOTE — Patient Instructions (Addendum)
Schedule your bone density  We will call with lab results Take the mobic once a day with food  F/U 6 months for physical

## 2019-05-14 NOTE — Progress Notes (Signed)
Subjective:    Patient ID: Kristy Butler, female    DOB: 02-14-1961, 58 y.o.   MRN: [id number]  Patient presents for Bone density (would like to be checked- dentist reports bone loss in jaw), Fatigue (would like vitamins checked), and Arthritis Pain (would like Mobic )   She has been working in the cold, she feels her joints are aching more. She has known DDD plus osteoarthritis in multiple joints including her ankles where she has an artificial joint.  She was on meloxicam in the past and this worked well for her she would like to restart.   Needs Bone Density- she was at dentist and was told she had Bone loss around her jaw diagnosed with with osteopenia   Fatigued all the time, states that she is working long hours and more physical labor which she is not used to since her husband died.  This would contribute to her joint aching per above is been she is also just tired even on her days off.  She asked that she can get B12 shot.  Also asked about dietary things that she can do  Hyperlipidemia- no current meds, last LDL 138  Due for Flu shot     Review Of Systems:  GEN- denies fatigue, fever, weight loss,weakness, recent illness HEENT- denies eye drainage, change in vision, nasal discharge, CVS- denies chest pain, palpitations RESP- denies SOB, cough, wheeze ABD- denies N/V, change in stools, abd pain GU- denies dysuria, hematuria, dribbling, incontinence MSK- + joint pain, muscle aches, injury Neuro- denies headache, dizziness, syncope, seizure activity       Objective:    BP 134/68   Pulse 68   Temp 98.8 F (37.1 C) (Temporal)   Resp 14   Ht 5\' 6"  (1.676 m)   Wt 128 lb (58.1 kg)   SpO2 99%   BMI 20.66 kg/m  GEN- NAD, alert and oriented x3 HEENT- PERRL, EOMI, non injected sclera, pink conjunctiva, MMM, oropharynx clear Neck- Supple, no thyromegaly CVS- RRR, no murmur RESP-CTAB MSK fair range of motion of bilateral knees spine ankles no effusion ligaments  intact EXT- No edema Pulses- Radial2+        Assessment & Plan:      Problem List Items Addressed This Visit      Unprioritized   Generalized OA    Osteoarthritis of multiple joints including her hands knees ankles also has degeneration in her spine.  She has had surgery done on her ankles states that her feet and ankles hurt a lot we did discuss orthopedics but she wants to hold off at this time.  We will start her back on meloxicam 15 mg once a day      Relevant Medications   meloxicam (MOBIC) 15 MG tablet   MDD (major depressive disorder)    She feels like the Wellbutrin is helping.  I would not change his medication.  I think her fatigue is just due to her working overly stressed in the setting of Covid she is also helping to care for her elderly mother. I will check her B12 level along with a thyroid but advised she does not any injections unless she truly has a significant deficiency.  She can take over-the-counter supplement along with her multivitamin.      Mild hyperlipidemia   Relevant Orders   Lipid Panel   Osteopenia   Relevant Orders   DG Bone Density   Vitamin D, 25-hydroxy    Other Visit Diagnoses  Encounter for screening mammogram for malignant neoplasm of breast    -  Primary   Relevant Orders   MM SCREENING BREAST TOMO BILATERAL   Fatigue, unspecified type       Relevant Orders   CBC with Differential   Comprehensive metabolic panel   Vitamin [id number]   Need for immunization against influenza       Relevant Orders   Flu Vaccine QUAD 36+ mos IM (Completed)      Note: This dictation was prepared with Dragon dictation along with smaller phrase technology. Any transcriptional errors that result from this process are unintentional.

## 2019-05-14 NOTE — Assessment & Plan Note (Signed)
Osteoarthritis of multiple joints including her hands knees ankles also has degeneration in her spine.  She has had surgery done on her ankles states that her feet and ankles hurt a lot we did discuss orthopedics but she wants to hold off at this time.  We will start her back on meloxicam 15 mg once a day

## 2019-05-14 NOTE — Assessment & Plan Note (Signed)
She feels like the Wellbutrin is helping.  I would not change his medication.  I think her fatigue is just due to her working overly stressed in the setting of Covid she is also helping to care for her elderly mother. I will check her B12 level along with a thyroid but advised she does not any injections unless she truly has a significant deficiency.  She can take over-the-counter supplement along with her multivitamin.

## 2019-05-15 LAB — LIPID PANEL
Cholesterol: 179 mg/dL (ref <200)
HDL: 52 mg/dL (ref >=50)
LDL Cholesterol (Calc): 112 mg/dL (calc) — ABNORMAL HIGH
Non-HDL Cholesterol (Calc): 127 mg/dL (calc) (ref <130)
Total CHOL/HDL Ratio: 3.4 (calc) (ref <5.0)
Triglycerides: 60 mg/dL (ref <150)

## 2019-05-15 LAB — COMPREHENSIVE METABOLIC PANEL
AG Ratio: 1.6 (calc) (ref 1.0–2.5)
ALT: 12 U/L (ref 6–29)
AST: 14 U/L (ref 10–35)
Albumin: 3.9 g/dL (ref 3.6–5.1)
Alkaline phosphatase (APISO): 76 U/L (ref 37–153)
BUN: 11 mg/dL (ref 7–25)
CO2: 26 mmol/L (ref 20–32)
Calcium: 9.2 mg/dL (ref 8.6–10.4)
Chloride: 107 mmol/L (ref 98–110)
Creat: 0.81 mg/dL (ref 0.50–1.05)
Globulin: 2.4 g/dL (calc) (ref 1.9–3.7)
Glucose, Bld: 76 mg/dL (ref 65–99)
Potassium: 4 mmol/L (ref 3.5–5.3)
Sodium: 143 mmol/L (ref 135–146)
Total Bilirubin: 0.4 mg/dL (ref 0.2–1.2)
Total Protein: 6.3 g/dL (ref 6.1–8.1)

## 2019-05-15 LAB — CBC WITH DIFFERENTIAL/PLATELET
Absolute Monocytes: 352 cells/uL (ref 200–950)
Basophils Absolute: 61 cells/uL (ref 0–200)
Basophils Relative: 1.1 %
Eosinophils Absolute: 160 cells/uL (ref 15–500)
Eosinophils Relative: 2.9 %
HCT: 38.7 % (ref 35.0–45.0)
Hemoglobin: 13.2 g/dL (ref 11.7–15.5)
Lymphs Abs: 2800 cells/uL (ref 850–3900)
MCH: 32.9 pg (ref 27.0–33.0)
MCHC: 34.1 g/dL (ref 32.0–36.0)
MCV: 96.5 fL (ref 80.0–100.0)
MPV: 9.9 fL (ref 7.5–12.5)
Monocytes Relative: 6.4 %
Neutro Abs: 2129 cells/uL (ref 1500–7800)
Neutrophils Relative %: 38.7 %
Platelets: 302 10*3/uL (ref 140–400)
RBC: 4.01 10*6/uL (ref 3.80–5.10)
RDW: 12.9 % (ref 11.0–15.0)
Total Lymphocyte: 50.9 %
WBC: 5.5 10*3/uL (ref 3.8–10.8)

## 2019-05-15 LAB — VITAMIN D 25 HYDROXY (VIT D DEFICIENCY, FRACTURES): Vit D, 25-Hydroxy: 23 ng/mL — ABNORMAL LOW (ref 30–100)

## 2019-05-15 LAB — VITAMIN B12: Vitamin B-12: 253 pg/mL (ref 200–1100)

## 2019-05-21 ENCOUNTER — Encounter: Payer: Self-pay | Admitting: *Deleted

## 2019-05-21 MED ORDER — VITAMIN D (ERGOCALCIFEROL) 1.25 MG (50000 UNIT) PO CAPS: 50000.0000 [IU] | ORAL_CAPSULE | ORAL | 0 refills | Status: DC

## 2019-06-18 ENCOUNTER — Other Ambulatory Visit: Payer: Self-pay | Admitting: Family Medicine

## 2019-06-21 ENCOUNTER — Encounter: Payer: Self-pay | Admitting: Family Medicine

## 2019-06-21 NOTE — Progress Notes (Signed)
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

## 2019-08-09 ENCOUNTER — Ambulatory Visit: Payer: BLUE CROSS/BLUE SHIELD

## 2019-08-09 ENCOUNTER — Other Ambulatory Visit: Payer: BLUE CROSS/BLUE SHIELD

## 2019-11-25 ENCOUNTER — Other Ambulatory Visit: Payer: Self-pay | Admitting: Family Medicine

## 2019-11-25 NOTE — Telephone Encounter (Signed)
Ok to refill??  Last office visit 05/14/2019.  Last refill 03/06/2019, #1 refill.

## 2019-12-25 ENCOUNTER — Other Ambulatory Visit: Payer: Self-pay

## 2019-12-25 ENCOUNTER — Ambulatory Visit
Admission: RE | Admit: 2019-12-25 | Discharge: 2019-12-25 | Disposition: A | Discharge status: Home / Self Care | Payer: BC Managed Care – PPO | Source: Ambulatory Visit | Attending: Family Medicine | Admitting: Family Medicine

## 2019-12-25 DIAGNOSIS — M8580 Other specified disorders of bone density and structure, unspecified site: Secondary | ICD-10-CM

## 2019-12-25 DIAGNOSIS — Z1231 Encounter for screening mammogram for malignant neoplasm of breast: Secondary | ICD-10-CM

## 2019-12-27 ENCOUNTER — Encounter: Payer: Self-pay | Admitting: *Deleted

## 2019-12-30 ENCOUNTER — Other Ambulatory Visit: Payer: Self-pay | Admitting: Family Medicine

## 2020-01-23 ENCOUNTER — Other Ambulatory Visit: Payer: Self-pay | Admitting: Family Medicine

## 2020-02-26 ENCOUNTER — Other Ambulatory Visit: Payer: Self-pay | Admitting: Family Medicine

## 2020-03-06 ENCOUNTER — Ambulatory Visit (INDEPENDENT_AMBULATORY_CARE_PROVIDER_SITE_OTHER): Payer: BC Managed Care – PPO | Admitting: Family Medicine

## 2020-03-06 ENCOUNTER — Encounter: Payer: Self-pay | Admitting: Family Medicine

## 2020-03-06 ENCOUNTER — Other Ambulatory Visit: Payer: Self-pay

## 2020-03-06 VITALS — BP 120/74 | HR 76 | Temp 98.1°F | Resp 14 | Ht 66.0 in | Wt 130.0 lb

## 2020-03-06 DIAGNOSIS — E559 Vitamin D deficiency, unspecified: Secondary | ICD-10-CM

## 2020-03-06 DIAGNOSIS — H02826 Cysts of left eye, unspecified eyelid: Secondary | ICD-10-CM

## 2020-03-06 DIAGNOSIS — Z23 Encounter for immunization: Secondary | ICD-10-CM

## 2020-03-06 DIAGNOSIS — E785 Hyperlipidemia, unspecified: Secondary | ICD-10-CM

## 2020-03-06 DIAGNOSIS — R232 Flushing: Secondary | ICD-10-CM | POA: Diagnosis not present

## 2020-03-06 DIAGNOSIS — F331 Major depressive disorder, recurrent, moderate: Secondary | ICD-10-CM

## 2020-03-06 NOTE — Progress Notes (Signed)
   Subjective:    Patient ID: Kristy Butler, female    DOB: 1960-06-17, 59 y.o.   MRN: [id number]  Patient presents for Stye (x1 month L upper eye lid no drainage) Patient here with whitish-yellow nodule to her left upper lid that has been present for the past month.  States that she had something before and she popped it.  She thinks it is nearly as she has had on her face and other spots.  She does have discomfort with trying to open and close her on because of the spot.  No change in vision. She did try warm compresses but no improvement   Complains of hot flashes has been going on for quite some time.  She want to know if there was something natural that she could try.  She does smoke.  She does feel like her Wellbutrin is helping.  Review Of Systems:  GEN- denies fatigue, fever, weight loss,weakness, recent illness HEENT- denies eye drainage, change in vision, nasal discharge, CVS- denies chest pain, palpitations RESP- denies SOB, cough, wheeze ABD- denies N/V, change in stools, abd pain GU- denies dysuria, hematuria, dribbling, incontinence MSK- denies joint pain, muscle aches, injury Neuro- denies headache, dizziness, syncope, seizure activity       Objective:    BP 120/74   Pulse 76   Temp 98.1 F (36.7 C) (Temporal)   Resp 14   Ht 5\' 6"  (1.676 m)   Wt 130 lb (59 kg)   SpO2 96%   BMI 20.98 kg/m  GEN- NAD, alert and oriented x3 HEENT- PERRL, EOMI, non injected sclera, pink conjunctiva, MMM, oropharynx clear , left upper lid yellow/white hard nodule, mild ttp  Neck- Supple, no LAD  CVS- RRR, no murmur RESP-CTAB Psych- normal affect and mood  EXT- No edema         Assessment & Plan:      Problem List Items Addressed This Visit      Unprioritized   MDD (major depressive disorder)    Taking Wellbutrin and benzo as needed      Mild hyperlipidemia    Differentials  are possible exanthaslema because of her hyperlipidemia vs Milia or other cystic lesion.   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    -  Primary   pt with eye discomfort recurrance of yellowish nodule, will refer to dermatology to excise   Relevant Orders   Ambulatory referral to Dermatology   Hot flashes       Post menopausal but smoker, will recommend she try Estroven OTC   Relevant Orders   CBC with Differential/Platelet   Comprehensive metabolic panel   TSH   Vitamin D deficiency       Relevant Orders   Vitamin D, 25-hydroxy   Need for immunization against influenza       Relevant Orders   Flu Vaccine QUAD 36+ mos IM (Completed)      Note: This dictation was prepared with Dragon dictation along with smaller phrase technology. Any transcriptional errors that result from this process are unintentional.

## 2020-03-06 NOTE — Assessment & Plan Note (Signed)
Taking Wellbutrin and benzo as needed

## 2020-03-06 NOTE — Patient Instructions (Addendum)
Referral to dermatology - Dr. Allyson Sabal dermatology  We will call with lab results  [place] for hotflashes- get at the Pharmacy or [facility]  F/U 4 months for Physical

## 2020-03-06 NOTE — Assessment & Plan Note (Signed)
Differentials  are possible exanthaslema because of her hyperlipidemia vs Milia or other cystic lesion.  We will refer her to dermatology to have this excised  Check Lipids

## 2020-03-07 LAB — COMPREHENSIVE METABOLIC PANEL
AG Ratio: 1.9 (calc) (ref 1.0–2.5)
ALT: 17 U/L (ref 6–29)
AST: 22 U/L (ref 10–35)
Albumin: 4.2 g/dL (ref 3.6–5.1)
Alkaline phosphatase (APISO): 80 U/L (ref 37–153)
BUN: 12 mg/dL (ref 7–25)
CO2: 28 mmol/L (ref 20–32)
Calcium: 9.7 mg/dL (ref 8.6–10.4)
Chloride: 106 mmol/L (ref 98–110)
Creat: 0.72 mg/dL (ref 0.50–1.05)
Globulin: 2.2 g/dL (calc) (ref 1.9–3.7)
Glucose, Bld: 109 mg/dL — ABNORMAL HIGH (ref 65–99)
Potassium: 3.9 mmol/L (ref 3.5–5.3)
Sodium: 142 mmol/L (ref 135–146)
Total Bilirubin: 0.3 mg/dL (ref 0.2–1.2)
Total Protein: 6.4 g/dL (ref 6.1–8.1)

## 2020-03-07 LAB — CBC WITH DIFFERENTIAL/PLATELET
Absolute Monocytes: 360 cells/uL (ref 200–950)
Basophils Absolute: 59 cells/uL (ref 0–200)
Basophils Relative: 1 %
Eosinophils Absolute: 260 cells/uL (ref 15–500)
Eosinophils Relative: 4.4 %
HCT: 39.3 % (ref 35.0–45.0)
Hemoglobin: 13.2 g/dL (ref 11.7–15.5)
Lymphs Abs: 2649 cells/uL (ref 850–3900)
MCH: 32.4 pg (ref 27.0–33.0)
MCHC: 33.6 g/dL (ref 32.0–36.0)
MCV: 96.3 fL (ref 80.0–100.0)
MPV: 9.9 fL (ref 7.5–12.5)
Monocytes Relative: 6.1 %
Neutro Abs: 2572 cells/uL (ref 1500–7800)
Neutrophils Relative %: 43.6 %
Platelets: 237 10*3/uL (ref 140–400)
RBC: 4.08 10*6/uL (ref 3.80–5.10)
RDW: 12.9 % (ref 11.0–15.0)
Total Lymphocyte: 44.9 %
WBC: 5.9 10*3/uL (ref 3.8–10.8)

## 2020-03-07 LAB — LIPID PANEL
Cholesterol: 167 mg/dL (ref <200)
HDL: 55 mg/dL (ref >=50)
LDL Cholesterol (Calc): 92 mg/dL (calc)
Non-HDL Cholesterol (Calc): 112 mg/dL (calc) (ref <130)
Total CHOL/HDL Ratio: 3 (calc) (ref <5.0)
Triglycerides: 100 mg/dL (ref <150)

## 2020-03-07 LAB — VITAMIN D 25 HYDROXY (VIT D DEFICIENCY, FRACTURES): Vit D, 25-Hydroxy: 35 ng/mL (ref 30–100)

## 2020-03-07 LAB — TSH: TSH: 0.76 mIU/L (ref 0.40–4.50)

## 2020-03-11 ENCOUNTER — Encounter: Payer: Self-pay | Admitting: *Deleted

## 2020-03-30 ENCOUNTER — Other Ambulatory Visit: Payer: Self-pay | Admitting: Family Medicine

## 2020-04-06 ENCOUNTER — Other Ambulatory Visit: Payer: Self-pay | Admitting: Family Medicine

## 2020-04-06 NOTE — Telephone Encounter (Signed)
Ok to refill??  Last office visit 03/06/2020.  Last refill 11/25/2019, #1 refill.

## 2020-05-07 ENCOUNTER — Other Ambulatory Visit: Payer: Self-pay | Admitting: Family Medicine

## 2020-05-27 ENCOUNTER — Encounter: Payer: Self-pay | Admitting: Family Medicine

## 2020-05-27 ENCOUNTER — Ambulatory Visit (INDEPENDENT_AMBULATORY_CARE_PROVIDER_SITE_OTHER): Payer: BC Managed Care – PPO | Admitting: Family Medicine

## 2020-05-27 ENCOUNTER — Other Ambulatory Visit: Payer: Self-pay

## 2020-05-27 VITALS — BP 120/70 | HR 96 | Temp 99.1°F | Ht 66.0 in | Wt 127.0 lb

## 2020-05-27 DIAGNOSIS — F411 Generalized anxiety disorder: Secondary | ICD-10-CM

## 2020-05-27 DIAGNOSIS — F4310 Post-traumatic stress disorder, unspecified: Secondary | ICD-10-CM

## 2020-05-27 DIAGNOSIS — M159 Polyosteoarthritis, unspecified: Secondary | ICD-10-CM

## 2020-05-27 DIAGNOSIS — F331 Major depressive disorder, recurrent, moderate: Secondary | ICD-10-CM | POA: Diagnosis not present

## 2020-05-27 DIAGNOSIS — M2550 Pain in unspecified joint: Secondary | ICD-10-CM | POA: Diagnosis not present

## 2020-05-27 MED ORDER — PAROXETINE HCL 10 MG PO TABS: 10.0000 mg | ORAL_TABLET | Freq: Every day | ORAL | 1 refills | Status: DC

## 2020-05-27 MED ORDER — METHYLPREDNISOLONE 4 MG PO TBPK: ORAL_TABLET | ORAL | 0 refills | Status: DC

## 2020-05-27 NOTE — Progress Notes (Signed)
Subjective:    Patient ID: Kristy Butler, female    DOB: 23-Apr-1961, 59 y.o.   MRN: [id number]  Patient presents for Anxiety (Just not feeling well/Depression/Legs in pain)  She has chronic leg pain, that throb at night, they trigger her when she needs   MDD- she feels overwhelmed, and tearful  feels like she just cannot get a break.  Her mother passed away in Jan 30, 2023.  Seems that all of her family members are passing away around her and some of them have been very traumatic this such as suicide and murder.  Every little thing has her own age.  Everything is hard for her to do such as simple things is going to get groceries or out to a restaurant with her family.  She would like to go back on the Paxil that did help when she was on the Wellbutrin.  She also wants to proceed with psychotherapy.  Her work is difficult for her as well she is not sure if that makes her pain worse the fact that her mental status is not where it should be makes the pain worse and difficulties with work.  She is not sleeping very well either.  She has pain in her hips and her legs daily and knots in her back. Other times she has pain in her hands her wrist or elbow seems like all the joints does ache.  She does have known osteoarthritis but she is concerned she may also have another underlying autoimmune arthritis.  Review Of Systems:  GEN- denies fatigue, fever, weight loss,weakness, recent illness HEENT- denies eye drainage, change in vision, nasal discharge, CVS- denies chest pain, palpitations RESP- denies SOB, cough, wheeze ABD- denies N/V, change in stools, abd pain GU- denies dysuria, hematuria, dribbling, incontinence MSK- + joint pain, muscle aches, injury Neuro- denies headache, dizziness, syncope, seizure activity       Objective:    BP 120/70   Pulse 96   Temp 99.1 F (37.3 C) (Oral)   Ht 5\' 6"  (1.676 m)   Wt 127 lb (57.6 kg)   SpO2 97%   BMI 20.50 kg/m  GEN- NAD, alert and oriented  x3 HEENT- PERRL, EOMI, non injected sclera, pink conjunctiva, MMM, oropharynx clear Neck- Supple, no thyromegaly CVS- RRR, no murmur RESP-CTAB MSK fair ROM bilat knees, hips/spine, no effusions, mild swelling right thumb, able to make fist bilat  Psych depressed affect tearful, not SI/ well groomed, good eye contact  EXT- No edema Pulses- Radial 2+        Assessment & Plan:      Problem List Items Addressed This Visit      Unprioritized   GAD (generalized anxiety disorder)   Relevant Medications   PARoxetine (PAXIL) 10 MG tablet   Other Relevant Orders   Ambulatory referral to Psychology   Generalized OA - Primary    Known generalized osteoarthritis but she has polyarthralgia which is very difficult to tease out in the setting of her depression.  I will go ahead and check her for autoimmune arthritis today.  She is also given Medrol Dosepak as meloxicam has not been helping as much.  Pending her lab results we will send her to rheumatology versus orthopedics      Relevant Medications   methylPREDNISolone (MEDROL DOSEPAK) 4 MG TBPK tablet   MDD (major depressive disorder)    Major depression with anxiety as well as PTSD experiencing multiple deaths of family members and other traumatic event throughout her  life.  She is ready to pursue psychotherapy.  We will also restart Paxil at 10 mg in addition to the Wellbutrin she is already taking she did well with this combination in the past.  No suicidal ideations.      Relevant Medications   PARoxetine (PAXIL) 10 MG tablet   Other Relevant Orders   Ambulatory referral to Psychology    Other Visit Diagnoses    Polyarthralgia       Relevant Orders   Sedimentation Rate   C-reactive protein   Rheumatoid factor   ANA   PTSD (post-traumatic stress disorder)       Relevant Medications   PARoxetine (PAXIL) 10 MG tablet   Other Relevant Orders   Ambulatory referral to Psychology      Note: This dictation was prepared with Dragon  dictation along with smaller phrase technology. Any transcriptional errors that result from this process are unintentional.

## 2020-05-27 NOTE — Assessment & Plan Note (Signed)
Known generalized osteoarthritis but she has polyarthralgia which is very difficult to tease out in the setting of her depression.  I will go ahead and check her for autoimmune arthritis today.  She is also given Medrol Dosepak as meloxicam has not been helping as much.  Pending her lab results we will send her to rheumatology versus orthopedics

## 2020-05-27 NOTE — Patient Instructions (Addendum)
Referral to therapy  ( [facility]/ [facility])  Stop the meloxicam Take the steroids We will call with lab results  F/U 4 weeks for medications

## 2020-05-27 NOTE — Assessment & Plan Note (Signed)
Major depression with anxiety as well as PTSD experiencing multiple deaths of family members and other traumatic event throughout her life.  She is ready to pursue psychotherapy.  We will also restart Paxil at 10 mg in addition to the Wellbutrin she is already taking she did well with this combination in the past.  No suicidal ideations.

## 2020-05-28 LAB — ANA: Anti Nuclear Antibody (ANA): NEGATIVE

## 2020-05-28 LAB — SEDIMENTATION RATE: Sed Rate: 6 mm/h (ref 0–30)

## 2020-05-28 LAB — RHEUMATOID FACTOR: Rheumatoid fact SerPl-aCnc: <14 IU/mL (ref <14)

## 2020-05-28 LAB — C-REACTIVE PROTEIN: CRP: 0.4 mg/L (ref <8.0)

## 2020-06-05 ENCOUNTER — Other Ambulatory Visit: Payer: Self-pay | Admitting: Family Medicine

## 2020-06-10 ENCOUNTER — Other Ambulatory Visit: Payer: Self-pay | Admitting: Family Medicine

## 2020-06-24 ENCOUNTER — Ambulatory Visit: Payer: BC Managed Care – PPO | Admitting: Family Medicine

## 2020-06-25 ENCOUNTER — Other Ambulatory Visit: Payer: Self-pay | Admitting: Family Medicine

## 2020-07-01 ENCOUNTER — Ambulatory Visit: Payer: BC Managed Care – PPO | Admitting: Family Medicine

## 2020-07-01 ENCOUNTER — Encounter: Payer: Self-pay | Admitting: Family Medicine

## 2020-07-01 ENCOUNTER — Other Ambulatory Visit: Payer: Self-pay

## 2020-07-01 VITALS — BP 132/68 | HR 80 | Temp 98.6°F | Resp 16 | Ht 66.0 in | Wt 131.0 lb

## 2020-07-01 DIAGNOSIS — F411 Generalized anxiety disorder: Secondary | ICD-10-CM | POA: Diagnosis not present

## 2020-07-01 DIAGNOSIS — M25551 Pain in right hip: Secondary | ICD-10-CM | POA: Diagnosis not present

## 2020-07-01 DIAGNOSIS — M1711 Unilateral primary osteoarthritis, right knee: Secondary | ICD-10-CM | POA: Diagnosis not present

## 2020-07-01 DIAGNOSIS — F331 Major depressive disorder, recurrent, moderate: Secondary | ICD-10-CM

## 2020-07-01 MED ORDER — DICLOFENAC SODIUM 75 MG PO TBEC: 75.0000 mg | DELAYED_RELEASE_TABLET | Freq: Two times a day (BID) | ORAL | 2 refills | Status: DC

## 2020-07-01 MED ORDER — PAROXETINE HCL 10 MG PO TABS: 10.0000 mg | ORAL_TABLET | Freq: Every day | ORAL | 3 refills | Status: DC

## 2020-07-01 MED ORDER — BUPROPION HCL ER (SR) 150 MG PO TB12: ORAL_TABLET | ORAL | 3 refills | Status: DC

## 2020-07-01 NOTE — Patient Instructions (Addendum)
Referral to [facility] Try the diclofenac twice a day  Continue the paxil  F/U 1 month Dr. Buelah Manis - Wed  F/U 4 months with Janett Billow for PHYSICAL

## 2020-07-01 NOTE — Progress Notes (Signed)
   Subjective:    Patient ID: Kristy Butler, female    DOB: Nov 25, 1960, 60 y.o.   MRN: [id number]  Patient presents for Follow-up (Is fasting/) Here for interim follow-up on her medications.  She was seen 4 weeks ago secondary to increased anxiety with depression.  Panicking at times.  She was started on Paxil 10 mg in addition to the Wellbutrin.  She states that she sees a world of a difference.  She does not feel anxious on edge anymore she is sleeping better and feels much more calmer.  She has not had any side effects with the medication.  She did reach out for psychotherapy at [facility] therefore to get her scheduled within the next 3 weeks.  Things have difficulty with her joints.  She does have known degenerative disc disease in her spine but has increased pain in her knee and her right hip.  She has underlying osteoarthritis.  Her autoimmune blood work from last month was negative.  She was given a Medrol Dosepak and states that she felt brand-new on the medication none of her joints ached the medication taken out of her system the pain came back.  Meloxicam has not been helping she would like to try something else in its place.    Review Of Systems:  GEN- denies fatigue, fever, weight loss,weakness, recent illness HEENT- denies eye drainage, change in vision, nasal discharge, CVS- denies chest pain, palpitations RESP- denies SOB, cough, wheeze ABD- denies N/V, change in stools, abd pain GU- denies dysuria, hematuria, dribbling, incontinence MSK- + joint pain, muscle aches, injury Neuro- denies headache, dizziness, syncope, seizure activity       Objective:    BP 132/68   Pulse 80   Temp 98.6 F (37 C) (Temporal)   Resp 16   Ht 5\' 6"  (1.676 m)   Wt 131 lb (59.4 kg)   SpO2 97%   BMI 21.14 kg/m  GEN- NAD, alert and oriented x3 Psych- pleasant, not anxious or depressed, good eye contact, normal speech       Assessment & Plan:      Problem List Items Addressed  This Visit      Unprioritized   GAD (generalized anxiety disorder)   Relevant Medications   buPROPion (WELLBUTRIN SR) 150 MG 12 hr tablet   PARoxetine (PAXIL) 10 MG tablet   MDD (major depressive disorder)    Symptoms improved.  She will continue to establish with a psychotherapist.  We will continue Paxil 10 mg along with Wellbutrin 150 mg twice a day.  Call if she has any issues with her medications.  Follow-up in 1 month.      Relevant Medications   buPROPion (WELLBUTRIN SR) 150 MG 12 hr tablet   PARoxetine (PAXIL) 10 MG tablet    Other Visit Diagnoses    Right hip pain    -  Primary   Relevant Orders   Ambulatory referral to Orthopedic Surgery   Primary osteoarthritis of right knee       Referral to orthopedics to establish care.  We will try her on diclofenac.  She has allergy to sulfa therefore Celebrex cannot be used.   Relevant Medications   diclofenac (VOLTAREN) 75 MG EC tablet   Other Relevant Orders   Ambulatory referral to Orthopedic Surgery      Note: This dictation was prepared with Dragon dictation along with smaller phrase technology. Any transcriptional errors that result from this process are unintentional.

## 2020-07-01 NOTE — Assessment & Plan Note (Signed)
Symptoms improved.  She will continue to establish with a psychotherapist.  We will continue Paxil 10 mg along with Wellbutrin 150 mg twice a day.  Call if she has any issues with her medications.  Follow-up in 1 month.

## 2020-07-06 ENCOUNTER — Other Ambulatory Visit: Payer: Self-pay | Admitting: Family Medicine

## 2020-07-06 NOTE — Telephone Encounter (Signed)
Ok to refill??  Last office visit 07/01/2020.  Last refill 04/07/2020, #1 refill.

## 2020-07-24 ENCOUNTER — Other Ambulatory Visit: Payer: Self-pay | Admitting: Family Medicine

## 2020-07-24 NOTE — Telephone Encounter (Signed)
Ok to refill Flexeril? 

## 2020-08-05 ENCOUNTER — Other Ambulatory Visit: Payer: Self-pay

## 2020-08-05 ENCOUNTER — Encounter: Payer: Self-pay | Admitting: Family Medicine

## 2020-08-05 ENCOUNTER — Ambulatory Visit: Payer: BC Managed Care – PPO | Admitting: Family Medicine

## 2020-08-05 VITALS — BP 140/80 | HR 77 | Temp 98.2°F | Resp 18 | Ht 66.0 in | Wt 135.8 lb

## 2020-08-05 DIAGNOSIS — F331 Major depressive disorder, recurrent, moderate: Secondary | ICD-10-CM

## 2020-08-05 DIAGNOSIS — F411 Generalized anxiety disorder: Secondary | ICD-10-CM | POA: Diagnosis not present

## 2020-08-05 MED ORDER — LORAZEPAM 1 MG PO TABS: 1.0000 mg | ORAL_TABLET | Freq: Three times a day (TID) | ORAL | 1 refills | Status: DC | PRN

## 2020-08-05 MED ORDER — BUPROPION HCL ER (SR) 150 MG PO TB12: ORAL_TABLET | ORAL | 3 refills | Status: DC

## 2020-08-05 MED ORDER — PAROXETINE HCL 20 MG PO TABS: ORAL_TABLET | ORAL | 3 refills | Status: DC

## 2020-08-05 NOTE — Patient Instructions (Signed)
F/U Kristy Butler in 6 weeks

## 2020-08-05 NOTE — Progress Notes (Signed)
   Subjective:    Patient ID: Kristy Butler, female    DOB: Apr 25, 1961, 60 y.o.   MRN: [id number]  Patient presents for Follow-up  Patient here for follow-up medications.  She was started on Paxil 10 mg in addition to her Wellbutrin which she was already taking 150 mg twice a day.  This to help treat both her depression and anxiety.  She was tolerating the medication quite well back in January with improvement in her symptoms.   She had appointment with therapist at [facility]  She has noticed more anxiety feels like the med helps but wearing off some, her therapist suggested upping medication and she would like to proceed with the change, she does feel like the medication helps a lot    She was also referred to orthopedics for her osteoarthritis in her hip and started on diclofenac. She was seen by by orthopedics, she had bursitis in he rhips and had injection done She was diagnosed with slipped disc with nerve impingement around L5 She was placed on prednisone She has a f/u appointment this afternoon    Review Of Systems:  GEN- denies fatigue, fever, weight loss,weakness, recent illness HEENT- denies eye drainage, change in vision, nasal discharge, CVS- denies chest pain, palpitations RESP- denies SOB, cough, wheeze ABD- denies N/V, change in stools, abd pain GU- denies dysuria, hematuria, dribbling, incontinence MSK- denies joint pain, muscle aches, injury Neuro- denies headache, dizziness, syncope, seizure activity       Objective:    BP 140/80 (BP Location: Left Arm, Patient Position: Sitting, Cuff Size: Normal)   Pulse 77   Temp 98.2 F (36.8 C) (Temporal)   Resp 18   Ht 5\' 6"  (1.676 m)   Wt 135 lb 12.8 oz (61.6 kg)   SpO2 96%   BMI 21.92 kg/m  GEN- NAD, alert and oriented x3 CVS- RRR, no murmur RESP-CTAB Psych normal affect and mood  Pulses- Radial  2+        Assessment & Plan:      Problem List Items Addressed This Visit      Unprioritized    GAD (generalized anxiety disorder) - Primary   Relevant Medications   PARoxetine (PAXIL) 20 MG tablet   buPROPion (WELLBUTRIN SR) 150 MG 12 hr tablet   LORazepam (ATIVAN) 1 MG tablet   MDD (major depressive disorder)    Increase paxil to 20mg  once a day Continue wellbutrin 150mg  BID Continue with psychotherapy Thus far has had good response to medication combo F/U 6 weeks with NP  Pt to call for any concerns with medication change       Relevant Medications   PARoxetine (PAXIL) 20 MG tablet   buPROPion (WELLBUTRIN SR) 150 MG 12 hr tablet   LORazepam (ATIVAN) 1 MG tablet      Note: This dictation was prepared with Dragon dictation along with smaller phrase technology. Any transcriptional errors that result from this process are unintentional.

## 2020-08-05 NOTE — Assessment & Plan Note (Addendum)
Increase paxil to 20mg  once a day Continue wellbutrin 150mg  BID Continue with psychotherapy Thus far has had good response to medication combo F/U 6 weeks with NP  Pt to call for any concerns with medication change

## 2020-09-16 ENCOUNTER — Ambulatory Visit: Payer: BC Managed Care – PPO | Admitting: Nurse Practitioner

## 2020-10-13 ENCOUNTER — Other Ambulatory Visit: Payer: Self-pay | Admitting: Family Medicine

## 2020-11-19 DIAGNOSIS — M4316 Spondylolisthesis, lumbar region: Secondary | ICD-10-CM | POA: Insufficient documentation

## 2020-11-26 ENCOUNTER — Other Ambulatory Visit: Payer: Self-pay | Admitting: Neurological Surgery

## 2020-12-04 ENCOUNTER — Telehealth: Payer: Self-pay | Admitting: Family Medicine

## 2020-12-04 NOTE — Telephone Encounter (Signed)
Patient called to request refill of  PARoxetine (PAXIL) 20 MG tablet [[id number]]   Pharmacy confirmed as   [facility] [place] [facility] [place] [facility]  [address] [place] [address]  Phone:  [phone]  Fax:  [phone]  DEA #:  [id number]  Please advise at [phone]

## 2020-12-21 NOTE — Telephone Encounter (Signed)
Follow up appointment for med refill already scheduled.

## 2020-12-23 ENCOUNTER — Other Ambulatory Visit: Payer: Self-pay | Admitting: *Deleted

## 2020-12-23 MED ORDER — BUPROPION HCL ER (SR) 150 MG PO TB12: ORAL_TABLET | ORAL | 3 refills | Status: DC

## 2020-12-30 ENCOUNTER — Ambulatory Visit: Payer: BC Managed Care – PPO | Admitting: Nurse Practitioner

## 2020-12-30 ENCOUNTER — Other Ambulatory Visit: Payer: Self-pay

## 2020-12-30 ENCOUNTER — Encounter: Payer: Self-pay | Admitting: Nurse Practitioner

## 2020-12-30 VITALS — BP 148/86 | HR 85 | Temp 98.8°F | Ht 66.0 in | Wt 140.4 lb

## 2020-12-30 DIAGNOSIS — G43009 Migraine without aura, not intractable, without status migrainosus: Secondary | ICD-10-CM | POA: Diagnosis not present

## 2020-12-30 DIAGNOSIS — Z1211 Encounter for screening for malignant neoplasm of colon: Secondary | ICD-10-CM

## 2020-12-30 DIAGNOSIS — F331 Major depressive disorder, recurrent, moderate: Secondary | ICD-10-CM | POA: Diagnosis not present

## 2020-12-30 DIAGNOSIS — F411 Generalized anxiety disorder: Secondary | ICD-10-CM

## 2020-12-30 DIAGNOSIS — F17200 Nicotine dependence, unspecified, uncomplicated: Secondary | ICD-10-CM

## 2020-12-30 DIAGNOSIS — Z23 Encounter for immunization: Secondary | ICD-10-CM

## 2020-12-30 DIAGNOSIS — L709 Acne, unspecified: Secondary | ICD-10-CM

## 2020-12-30 MED ORDER — SHINGRIX 50 MCG/0.5ML IM SUSR: 0.5000 mL | Freq: Once | INTRAMUSCULAR | 0 refills | Status: AC

## 2020-12-30 MED ORDER — PAROXETINE HCL 20 MG PO TABS: 20.0000 mg | ORAL_TABLET | Freq: Every day | ORAL | 1 refills | Status: DC

## 2020-12-30 MED ORDER — RETIN-A MICRO 0.1 % EX GEL: Freq: Every day | CUTANEOUS | 3 refills | Status: DC

## 2020-12-30 MED ORDER — RIZATRIPTAN BENZOATE 10 MG PO TBDP: ORAL_TABLET | ORAL | 2 refills | Status: AC

## 2020-12-30 NOTE — Assessment & Plan Note (Signed)
Chronic, stable.  PHQ-9 and GAD-7 much improved today.  No SI/HI.  Will continue paroxetine 20 mg daily and wellbutrin 150 mg twice daily for now.  Refills given.  Also uses lorazepam sparingly for panic, does not need a refill today.  Continue with counselor as well.  Plan to follow up in ~4 months for physical.

## 2020-12-30 NOTE — Assessment & Plan Note (Signed)
Chronic, stable with prn use of rizatriptan.  No red flags in history or on examination, will plan to continue to rescue triptan.  Follow up as needed.

## 2020-12-30 NOTE — Assessment & Plan Note (Signed)
Chronic, continues to try to cut back.  She is interested in quitting smoking in the future, but not at this time.  Discussed screening with low dose CT scan in future should she continue smoke and qualify.  Will touch on at upcoming physical.

## 2020-12-30 NOTE — Progress Notes (Signed)
Subjective:    Patient ID: Kristy Butler, female    DOB: [birth year], 60 y.o.   MRN: [id number]  HPI: Kristy Butler is a 60 y.o. female presenting for follow up.  Chief Complaint  Patient presents with   Anxiety   Medication Refill    Maxalt, paxil   ANXIETY/STRESS Paxil is working great.  Was having a lot of panic attacks prior.  Also taking wellbutrin 150 mg twice daily.  Uses lorazepam very sparingly - maybe once every few weeks.  Was using a lot more prior to being stable on Paxil. Reports job is very stressful and hard on the body.  She has a history of PTSD and felt like she was falling over the edge when working.  Also following with Counselor at [facility] in [place]. Duration: chronic Anxious mood: no  Excessive worrying: no Irritability: no  Sweating: no Nausea: no Palpitations:no Hyperventilation: no Panic attacks: yes Agoraphobia: no  Obscessions/compulsions: no Depressed mood: no Depression screen [facility] 2/9 12/30/2020 05/27/2020 01/22/2018 06/23/2017 04/24/2017  Decreased Interest 0 2 0 0 2  Down, Depressed, Hopeless 0 3 0 1 2  PHQ - 2 Score 0 5 0 1 4  Altered sleeping 2 3 - 0 0  Tired, decreased energy 0 3 - 0 1  Change in appetite 1 2 - 2 2  Feeling bad or failure about yourself  0 1 - 0 0  Trouble concentrating 0 2 - 0 2  Moving slowly or fidgety/restless 0 1 - 0 0  Suicidal thoughts 0 0 - 0 0  PHQ-9 Score 3 17 - 3 9  Difficult doing work/chores Not difficult at all Very difficult - Somewhat difficult Very difficult    GAD 7 : Generalized Anxiety Score 12/30/2020 05/27/2020  Nervous, Anxious, on Edge 0 1  Control/stop worrying 0 1  Worry too much - different things 0 3  Trouble relaxing 0 2  Restless 0 1  Easily annoyed or irritable 0 1  Afraid - awful might happen 0 3  Total GAD 7 Score 0 12  Anxiety Difficulty Not difficult at all Very difficult    Anhedonia: no Weight changes: no Insomnia: no   Hypersomnia: no Fatigue/loss of  energy: yes Feelings of worthlessness: no Feelings of guilt: no Impaired concentration/indecisiveness: no Suicidal ideations: no  Crying spells: no Recent Stressors/Life Changes: no   Relationship problems: no   Family stress: no     Financial stress: no    Job stress: no    Recent death/loss: no  MIGRAINE Has had migraines for years.  Takes rizatriptan and migraine goes away after she sleeps.  Taking rizatriptan maybe 2 times per year. Duration: years Onset: sudden Severity: severe Frequency: once per year Location: back of skull Headache duration: 2 days Radiation: no Time of day headache occurs:  random Alleviating factors:  Aggravating factors:  Headache status at time of visit: asymptomatic Treatments attempted: triptan, rest,    Aura: no Nausea:  yes Vomiting: yes Photophobia:  no Phonophobia:  no Effect on social functioning:  yes Confusion:  no Gait disturbance/ataxia:  no Behavioral changes:  no Fevers:  no  Is having surgery on her back next week.  Hoping to come off of gabapentin after surgery.    Requesting refill of Retin-A today for her skin.  TOBACCO USER Smoking Status: current smoker Smoking Amount: 1/2 ppd Smoking Onset: 47s Smoking Quit Date: TBD Smoking triggers: stress Type of tobacco use: cigarettes  Allergies  Allergen Reactions   Elemental Sulfur Hives, Itching and Swelling    - sulfa based antibiotic eye drop     Outpatient Encounter Medications as of 12/30/2020  Medication Sig   buPROPion (WELLBUTRIN SR) 150 MG 12 hr tablet TAKE 1 TABLET(150 MG) BY MOUTH TWICE DAILY   cholecalciferol (VITAMIN D3) 25 MCG (1000 UNIT) tablet Take 1,000 Units by mouth daily.   gabapentin (NEURONTIN) 300 MG capsule Take 900 mg by mouth 3 (three) times daily.   LORazepam (ATIVAN) 1 MG tablet Take 1 tablet (1 mg total) by mouth 3 (three) times daily as needed.   MYRBETRIQ 50 MG TB24 tablet TAKE 1 TABLET BY MOUTH DAILY FOR BLADDER.   Zoster Vaccine  Adjuvanted [facility]) injection Inject 0.5 mLs into the muscle once for 1 dose. Administer second dose 2-6 months after first dose.   [DISCONTINUED] cyclobenzaprine (FLEXERIL) 5 MG tablet TAKE 1 TABLET(5 MG) BY MOUTH THREE TIMES DAILY AS NEEDED FOR MUSCLE SPASMS   [DISCONTINUED] ketorolac (TORADOL) 60 MG/2ML SOLN injection Inject 60 mg into the muscle once.   [DISCONTINUED] PARoxetine (PAXIL) 20 MG tablet TAKE 1 TABLET '20MG'$   BY MOUTH DAILY   [DISCONTINUED] rizatriptan (MAXALT-MLT) 10 MG disintegrating tablet TAKE 1 TABLET BY MOUTH AS NEEDED FOR MIGRAINE. MAY REPEAT IN 2 HOURS IF NEEDED.   [DISCONTINUED] tretinoin microspheres (RETIN-A MICRO) 0.1 % gel Apply topically at bedtime.   PARoxetine (PAXIL) 20 MG tablet Take 1 tablet (20 mg total) by mouth daily.   RETIN-A MICRO 0.1 % gel Apply topically at bedtime.   rizatriptan (MAXALT-MLT) 10 MG disintegrating tablet TAKE 1 TABLET BY MOUTH AS NEEDED FOR MIGRAINE. MAY REPEAT IN 2 HOURS IF NEEDED.   [DISCONTINUED] diclofenac (VOLTAREN) 75 MG EC tablet Take 1 tablet (75 mg total) by mouth 2 (two) times daily.   No facility-administered encounter medications on file as of 12/30/2020.    Patient Active Problem List   Diagnosis Date Noted   Migraine without aura and without status migrainosus, not intractable 12/30/2020   Spondylolisthesis, lumbar region 11/19/2020   Mild hyperlipidemia 05/14/2019   Osteopenia 05/14/2019   Generalized OA 05/14/2019   Mixed incontinence 11/21/2018   Adult acne 11/21/2018   Tobacco use disorder 01/22/2018   Migraines 04/24/2017   GAD (generalized anxiety disorder) 04/24/2017   Abnormal stress test    MDD (major depressive disorder) 09/29/2015    Past Medical History:  Diagnosis Date   Anxiety    Arthritis    "top of my neck, spine, right knee" (04/04/2013)   Depression    GERD (gastroesophageal reflux disease)    Migraines    "last one was ~ 5 yr ago" (04/04/2013)   Shortness of breath    "just related to  my heart issues right now" (04/04/2013)   Urinary incontinence     Relevant past medical, surgical, family and social history reviewed and updated as indicated. Interim medical history since our last visit reviewed.  Review of Systems Per HPI unless specifically indicated above     Objective:    BP (!) 148/86   Pulse 85   Temp 98.8 F (37.1 C)   Ht '5\' 6"'$  (1.676 m)   Wt 140 lb 6.4 oz (63.7 kg)   SpO2 97%   BMI 22.66 kg/m   Wt Readings from Last 3 Encounters:  12/30/20 140 lb 6.4 oz (63.7 kg)  08/05/20 135 lb 12.8 oz (61.6 kg)  07/01/20 131 lb (59.4 kg)    Physical Exam Vitals and nursing  note reviewed.  Constitutional:      General: She is not in acute distress.    Appearance: Normal appearance. She is not toxic-appearing.  HENT:     Head: Normocephalic and atraumatic.  Eyes:     General: No scleral icterus.       Right eye: No discharge.        Left eye: No discharge.     Extraocular Movements: Extraocular movements intact.     Pupils: Pupils are equal, round, and reactive to light.  Cardiovascular:     Rate and Rhythm: Regular rhythm.     Heart sounds: Normal heart sounds. No murmur heard. Pulmonary:     Effort: Pulmonary effort is normal. No respiratory distress.     Breath sounds: Normal breath sounds. No wheezing, rhonchi or rales.  Musculoskeletal:        General: Normal range of motion.     Cervical back: Normal range of motion.     Right lower leg: No edema.     Left lower leg: No edema.  Lymphadenopathy:     Cervical: No cervical adenopathy.  Skin:    General: Skin is warm and dry.     Capillary Refill: Capillary refill takes less than 2 seconds.     Coloration: Skin is not jaundiced or pale.     Findings: No erythema.  Neurological:     Mental Status: She is alert and oriented to person, place, and time.     Motor: No weakness.     Gait: Gait normal.  Psychiatric:        Mood and Affect: Mood normal.        Behavior: Behavior normal.         Thought Content: Thought content normal.        Judgment: Judgment normal.      Assessment & Plan:   Problem List Items Addressed This Visit       Cardiovascular and Mediastinum   Migraine without aura and without status migrainosus, not intractable    Chronic, stable with prn use of rizatriptan.  No red flags in history or on examination, will plan to continue to rescue triptan.  Follow up as needed.       Relevant Medications   gabapentin (NEURONTIN) 300 MG capsule   PARoxetine (PAXIL) 20 MG tablet   rizatriptan (MAXALT-MLT) 10 MG disintegrating tablet     Musculoskeletal and Integument   Adult acne    Refill given for Retin A gel for adult acne.         Relevant Medications   RETIN-A MICRO 0.1 % gel     Other   Tobacco use disorder    Chronic, continues to try to cut back.  She is interested in quitting smoking in the future, but not at this time.  Discussed screening with low dose CT scan in future should she continue smoke and qualify.  Will touch on at upcoming physical.       MDD (major depressive disorder) - Primary    Chronic, stable.  PHQ-9 and GAD-7 much improved today.  No SI/HI.  Will continue paroxetine 20 mg daily and wellbutrin 150 mg twice daily for now.  Refills given.  Also uses lorazepam sparingly for panic, does not need a refill today.  Continue with counselor as well.  Plan to follow up in ~4 months for physical.       Relevant Medications   PARoxetine (PAXIL) 20 MG tablet   GAD (generalized  anxiety disorder)    Chronic, stable.  PHQ-9 and GAD-7 much improved today.  No SI/HI.  Will continue paroxetine 20 mg daily and wellbutrin 150 mg twice daily for now.  Refills given.  Also uses lorazepam sparingly for panic, does not need a refill today.  Continue with counselor as well.  Plan to follow up in ~4 months for physical.        Relevant Medications   PARoxetine (PAXIL) 20 MG tablet   Other Visit Diagnoses     Need for shingles vaccine        Relevant Medications   Zoster Vaccine Adjuvanted [facility]) injection   Screening for colon cancer       Relevant Orders   Ambulatory referral to Gastroenterology        Follow up plan: Return for CPE with fasting labs.

## 2020-12-30 NOTE — Assessment & Plan Note (Signed)
Refill given for Retin A gel for adult acne.

## 2021-01-01 ENCOUNTER — Telehealth: Payer: Self-pay

## 2021-01-01 ENCOUNTER — Other Ambulatory Visit: Payer: Self-pay

## 2021-01-01 DIAGNOSIS — L709 Acne, unspecified: Secondary | ICD-10-CM

## 2021-01-01 MED ORDER — RETIN-A MICRO 0.1 % EX GEL: Freq: Every day | CUTANEOUS | 3 refills | Status: DC

## 2021-01-01 NOTE — Telephone Encounter (Signed)
Pt called in stating that she was recently seen in office and NP sent some prescriptions to pharmacy, but his med RETIN-A MICRO 0.1 % gel was not sent. Pt would like this med resent to pharmacy if possible. Please advise.  Cb#: [phone]

## 2021-01-04 NOTE — Progress Notes (Signed)
Surgical Instructions    Your procedure is scheduled on Tuesday August 9th.  Report to [facility] Main Entrance "A" at 5:30 A.M., then check in with the Admitting office.  Call this number if you have problems the morning of surgery:  [phone]   If you have any questions prior to your surgery date call [phone]: Open Monday-Friday 8am-4pm    Remember:  Do not eat after midnight the night before your surgery  You may drink clear liquids until 4:30am the morning of your surgery.   Clear liquids allowed are: Water, Non-Citrus Juices (without pulp), Carbonated Beverages, Clear Tea, Black Coffee Only, and Gatorade    Take these medicines the morning of surgery with A SIP OF WATER  buPROPion (WELLBUTRIN SR) 150 MG 12 hr tablet gabapentin (NEURONTIN) 300 MG capsule MYRBETRIQ 50 MG TB24 tablet PARoxetine (PAXIL) 20 MG tablet  IF NEEDED hydroxypropyl methylcellulose / hypromellose (ISOPTO TEARS / GONIOVISC) 2.5 % ophthalmic solution LORazepam (ATIVAN) 1 MG tablet rizatriptan (MAXALT-MLT) 10 MG disintegrating tablet  As of today, STOP taking any Aspirin (unless otherwise instructed by your surgeon) Aleve, Naproxen, Ibuprofen, Motrin, Advil, Goody's, BC's, all herbal medications, fish oil, and all vitamins.          Do not wear jewelry or makeup Do not wear lotions, powders, perfumes, or deodorant. Do not shave 48 hours prior to surgery.   Do not bring valuables to the hospital. DO Not wear nail polish, gel polish, artificial nails, or any other type of covering on  natural nails including finger and toenails. If patients have artificial nails, gel coating, etc. that need to be removed by a nail salon please have this removed prior to surgery or surgery may need to be canceled/delayed if the surgeon/ anesthesia feels like the patient is unable to be adequately monitored.             [facility] is not responsible for any belongings or valuables.  Do NOT Smoke (Tobacco/Vaping) or  drink Alcohol 24 hours prior to your procedure If you use a CPAP at night, you may bring all equipment for your overnight stay.   Contacts, glasses, dentures or bridgework may not be worn into surgery, please bring cases for these belongings   For patients admitted to the hospital, discharge time will be determined by your treatment team.   Patients discharged the day of surgery will not be allowed to drive home, and someone needs to stay with them for 24 hours.  ONLY 1 SUPPORT PERSON MAY BE PRESENT WHILE YOU ARE IN SURGERY. IF YOU ARE TO BE ADMITTED ONCE YOU ARE IN YOUR ROOM YOU WILL BE ALLOWED TWO (2) VISITORS.  Minor children may have two parents present. Special consideration for safety and communication needs will be reviewed on a case by case basis.  Special instructions:    Oral Hygiene is also important to reduce your risk of infection.  Remember - BRUSH YOUR TEETH THE MORNING OF SURGERY WITH YOUR REGULAR TOOTHPASTE   [place]- Preparing For Surgery  Before surgery, you can play an important role. Because skin is not sterile, your skin needs to be as free of germs as possible. You can reduce the number of germs on your skin by washing with CHG (chlorahexidine gluconate) Soap before surgery.  CHG is an antiseptic cleaner which kills germs and bonds with the skin to continue killing germs even after washing.     Please do not use if you have an allergy to CHG  or antibacterial soaps. If your skin becomes reddened/irritated stop using the CHG.  Do not shave (including legs and underarms) for at least 48 hours prior to first CHG shower. It is OK to shave your face.  Please follow these instructions carefully.     Shower the NIGHT BEFORE SURGERY and the MORNING OF SURGERY with CHG Soap.   If you chose to wash your hair, wash your hair first as usual with your normal shampoo. After you shampoo, rinse your hair and body thoroughly to remove the shampoo.  Then [employer] and genitals  (private parts) with your normal soap and rinse thoroughly to remove soap.  After that Use CHG Soap as you would any other liquid soap. You can apply CHG directly to the skin and wash gently with a scrungie or a clean washcloth.   Apply the CHG Soap to your body ONLY FROM THE NECK DOWN.  Do not use on open wounds or open sores. Avoid contact with your eyes, ears, mouth and genitals (private parts). Wash Face and genitals (private parts)  with your normal soap.   Wash thoroughly, paying special attention to the area where your surgery will be performed.  Thoroughly rinse your body with warm water from the neck down.  DO NOT shower/wash with your normal soap after using and rinsing off the CHG Soap.  Pat yourself dry with a CLEAN TOWEL.  Wear CLEAN PAJAMAS to bed the night before surgery  Place CLEAN SHEETS on your bed the night before your surgery  DO NOT SLEEP WITH PETS.   Day of Surgery:  Take a shower with CHG soap. Wear Clean/Comfortable clothing the morning of surgery Do not apply any deodorants/lotions.   Remember to brush your teeth WITH YOUR REGULAR TOOTHPASTE.   Please read over the following fact sheets that you were given.

## 2021-01-05 ENCOUNTER — Encounter (HOSPITAL_COMMUNITY)
Admission: RE | Admit: 2021-01-05 | Discharge: 2021-01-05 | Disposition: A | Discharge status: Home / Self Care | Payer: BC Managed Care – PPO | Source: Ambulatory Visit | Attending: Neurological Surgery | Admitting: Neurological Surgery

## 2021-01-05 ENCOUNTER — Encounter (HOSPITAL_COMMUNITY): Payer: Self-pay

## 2021-01-05 ENCOUNTER — Other Ambulatory Visit: Payer: Self-pay

## 2021-01-05 DIAGNOSIS — Z01818 Encounter for other preprocedural examination: Secondary | ICD-10-CM | POA: Diagnosis not present

## 2021-01-05 DIAGNOSIS — F17210 Nicotine dependence, cigarettes, uncomplicated: Secondary | ICD-10-CM | POA: Insufficient documentation

## 2021-01-05 DIAGNOSIS — F419 Anxiety disorder, unspecified: Secondary | ICD-10-CM | POA: Insufficient documentation

## 2021-01-05 DIAGNOSIS — Z79899 Other long term (current) drug therapy: Secondary | ICD-10-CM | POA: Diagnosis not present

## 2021-01-05 DIAGNOSIS — M5416 Radiculopathy, lumbar region: Secondary | ICD-10-CM | POA: Diagnosis not present

## 2021-01-05 DIAGNOSIS — F32A Depression, unspecified: Secondary | ICD-10-CM | POA: Diagnosis not present

## 2021-01-05 LAB — BASIC METABOLIC PANEL
Anion gap: 6 (ref 5–15)
BUN: 9 mg/dL (ref 6–20)
CO2: 29 mmol/L (ref 22–32)
Calcium: 8.8 mg/dL — ABNORMAL LOW (ref 8.9–10.3)
Chloride: 107 mmol/L (ref 98–111)
Creatinine, Ser: 0.8 mg/dL (ref 0.44–1.00)
GFR, Estimated: >60 mL/min (ref >60)
Glucose, Bld: 106 mg/dL — ABNORMAL HIGH (ref 70–99)
Potassium: 3.9 mmol/L (ref 3.5–5.1)
Sodium: 142 mmol/L (ref 135–145)

## 2021-01-05 LAB — CBC
HCT: 40.2 % (ref 36.0–46.0)
Hemoglobin: 12.9 g/dL (ref 12.0–15.0)
MCH: 32 pg (ref 26.0–34.0)
MCHC: 32.1 g/dL (ref 30.0–36.0)
MCV: 99.8 fL (ref 80.0–100.0)
Platelets: 303 10*3/uL (ref 150–400)
RBC: 4.03 MIL/uL (ref 3.87–5.11)
RDW: 14.1 % (ref 11.5–15.5)
WBC: 7.1 10*3/uL (ref 4.0–10.5)
nRBC: 0 % (ref 0.0–0.2)

## 2021-01-05 LAB — TYPE AND SCREEN
ABO/RH(D): O POS
Antibody Screen: NEGATIVE

## 2021-01-05 LAB — SURGICAL PCR SCREEN
MRSA, PCR: NEGATIVE
Staphylococcus aureus: POSITIVE — AB

## 2021-01-05 NOTE — Progress Notes (Signed)
PCP: Noemi Chapel, NP Cardiologist: [name], MD  EKG: 01/05/21 CXR: na ECHO: 04/05/13 Stress Test: 10/05/16 Cardiac Cath: 10/12/16  Fasting Blood Sugar- na Checks Blood Sugar__na_ times a day  OSA/CPAP: No  ASA/Blood Thinner: No  Covid test 01/08/21 or 01/11/21.  Instructions and directions given.  Anesthesia Review: Yes, cardiac studies although patient states they were normal and symptoms were related to stress. Obtained EKG at PAT visit due to elevated BP and patient stated her PCP told her last week it was elevated.   Patient denies shortness of breath, fever, cough, and chest pain at PAT appointment.  Patient verbalized understanding of instructions provided today at the PAT appointment.  Patient asked to review instructions at home and day of surgery.

## 2021-01-06 ENCOUNTER — Telehealth: Payer: Self-pay | Admitting: *Deleted

## 2021-01-06 MED ORDER — TRETINOIN MICROSPHERE PUMP 0.1 % EX GEL: 1.0000 | CUTANEOUS | 3 refills | Status: DC

## 2021-01-06 NOTE — Anesthesia Preprocedure Evaluation (Addendum)
Anesthesia Evaluation  Patient identified by MRN, date of birth, ID band Patient awake    Reviewed: Allergy & Precautions, NPO status , Patient's Chart, lab work & pertinent test results  Airway Mallampati: II  TM Distance: >3 FB Neck ROM: Full    Dental  (+) Dental Advisory Given, Teeth Intact   Pulmonary shortness of breath, Current Smoker and Patient abstained from smoking.,    Pulmonary exam normal breath sounds clear to auscultation       Cardiovascular negative cardio ROS Normal cardiovascular exam Rhythm:Regular Rate:Normal     Neuro/Psych  Headaches, PSYCHIATRIC DISORDERS Anxiety Depression    GI/Hepatic Neg liver ROS, GERD  ,  Endo/Other  negative endocrine ROS  Renal/GU negative Renal ROS     Musculoskeletal  (+) Arthritis ,   Abdominal   Peds  Hematology negative hematology ROS (+)   Anesthesia Other Findings   Reproductive/Obstetrics                           Anesthesia Physical Anesthesia Plan  ASA: 3  Anesthesia Plan: General   Post-op Pain Management:    Induction: Intravenous  PONV Risk Score and Plan: 3 and Ondansetron, Dexamethasone, Treatment may vary due to age or medical condition, Midazolam and Diphenhydramine  Airway Management Planned: Oral ETT  Additional Equipment:   Intra-op Plan:   Post-operative Plan: Extubation in OR  Informed Consent: I have reviewed the patients History and Physical, chart, labs and discussed the procedure including the risks, benefits and alternatives for the proposed anesthesia with the patient or authorized representative who has indicated his/her understanding and acceptance.     Dental advisory given  Plan Discussed with: CRNA  Anesthesia Plan Comments: (PAT note written 01/06/2021 by Myra Gianotti, PA-C. )      Anesthesia Quick Evaluation

## 2021-01-06 NOTE — Telephone Encounter (Signed)
Prescription sent to pharmacy.

## 2021-01-06 NOTE — Telephone Encounter (Signed)
Received fax requesting alternative to Retin A gel.  Preferred alternatives include Tretinoin Microsphere.   Please advise.

## 2021-01-06 NOTE — Progress Notes (Signed)
Anesthesia Chart Review:  Case: [id number] Date/Time: 01/12/21 0715   Procedure: Lumbar 4-5 Minimally invasive transforaminal lumbar interbody fusion   Anesthesia type: General   Pre-op diagnosis: Lumbar radiculopathy   Location: [facility] OR ROOM 20 / [place] OR   Surgeons: Judith Part, MD       DISCUSSION: Patient is a 60 year old female scheduled for the above procedure.  History includes smoking, GERD, migraines, anxiety, depression, dyspnea (2014; normal coronaries 2018).  Preoperative COVID-19 testing is scheduled for either 01/08/2021 or 01/11/2021.  Anesthesia team to evaluate on the day of surgery.  VS: BP (!) 143/85   Pulse 75   Temp 37 C (Oral)   Resp 17   Ht '5\' 6"'$  (1.676 m)   Wt 65.6 kg   SpO2 99%   BMI 23.34 kg/m   PROVIDERS: Eulogio Bear, NP is PCP  -She is not followed routinely by cardiology, but had and echo in 2014 and evaluation by Cecilie Kicks, NP/[place], Elta Guadeloupe, MD in 09/2016 for typical and atypical chest pain. She had a stress test and cardiac cath in 10/2016. Coronaries were normal LVEF low normal 50-55%.      LABS: Labs reviewed: Acceptable for surgery. (all labs ordered are listed, but only abnormal results are displayed)  Labs Reviewed  SURGICAL PCR SCREEN - Abnormal; Notable for the following components:      Result Value   Staphylococcus aureus POSITIVE (*)    All other components within normal limits  BASIC METABOLIC PANEL - Abnormal; Notable for the following components:   Glucose, Bld 106 (*)    Calcium 8.8 (*)    All other components within normal limits  CBC  TYPE AND SCREEN     IMAGES: MRI L-spine 08/13/20 ([facility]/PACS): IMPRESSION: 1. L4-L5 severe facet arthrosis with grade 1 anterolisthesis and severe right neural foraminal stenosis. 2. Mild L3-4 and L4-5 degenerative disc disease without spinal canal or neural foraminal stenosis.   EKG: 01/05/21: NSR.  RSR prime in V1 suggests right ventricular conduction delay (old) negative T  waves in lead III.   CV: Cardiac cath 10/12/16: The left ventricular ejection fraction is 50-55% by visual estimate. The left ventricular systolic function is normal. LV end diastolic pressure is normal.   1. Normal coronary arteries. 2. Low normal LV systolic function with an EF of 50-55%. High normal left ventricular end-diastolic pressure.   Recommendations: Noncardiac chest pain.    Nuclear stress test 10/05/16: Nuclear stress EF: 52%. Blood pressure demonstrated a normal response to exercise. Upsloping ST segment depression ST segment depression was noted during stress in the II, III, aVF, V5 and V6 leads. This is a low risk study. The left ventricular ejection fraction is mildly decreased (45-54%).   ECG positive in recovery with T inversion V4 and upsloping ST depression in inferior lateral leads Normal Perfusion EF 52%   Echo 04/05/13: Study Conclusions  - Left ventricle: The cavity size was normal. Wall thickness    was normal. Systolic function was normal. The estimated    ejection fraction was in the range of 50% to 55%. Wall    motion was normal; there were no regional wall motion    abnormalities. Doppler parameters are consistent with    abnormal left ventricular relaxation (grade 1 diastolic    dysfunction).  - Atrial septum: No defect or patent foramen ovale was    identified.   Past Medical History:  Diagnosis Date   Anxiety    Arthritis    "top of  my neck, spine, right knee" (04/04/2013)   Depression    GERD (gastroesophageal reflux disease)    Migraines    "last one was ~ 5 yr ago" (04/04/2013)   Shortness of breath    "just related to my heart issues right now" (04/04/2013)   Urinary incontinence     Past Surgical History:  Procedure Laterality Date   GREAT TOE ARTHRODESIS, INTERPHALANGEAL JOINT Right 2010   "replaced joint in my foot" (04/04/2013)   JOINT REPLACEMENT     LEFT HEART CATH AND CORONARY ANGIOGRAPHY N/A 10/12/2016   Procedure:  Left Heart Cath and Coronary Angiography;  Surgeon: Wellington Hampshire, MD;  Location: [facility];  Service: Cardiovascular;  Laterality: N/A;   TUBAL LIGATION  ~ 1993   TURBINATE RESECTION Bilateral ~ 1990    MEDICATIONS:  buPROPion (WELLBUTRIN SR) 150 MG 12 hr tablet   diphenhydrAMINE-Phenylephrine (BENADRYL ALLERGY CON ULTRATABS PO)   gabapentin (NEURONTIN) 300 MG capsule   hydroxypropyl methylcellulose / hypromellose (ISOPTO TEARS / GONIOVISC) 2.5 % ophthalmic solution   LORazepam (ATIVAN) 1 MG tablet   MYRBETRIQ 50 MG TB24 tablet   PARoxetine (PAXIL) 20 MG tablet   RETIN-A MICRO 0.1 % gel   rizatriptan (MAXALT-MLT) 10 MG disintegrating tablet   No current facility-administered medications for this encounter.    Myra Gianotti, PA-C Surgical Short Stay/Anesthesiology [facility] Phone [phone] [facility] Phone [phone] 01/06/2021 3:48 PM

## 2021-01-08 ENCOUNTER — Other Ambulatory Visit: Payer: Self-pay | Admitting: Neurological Surgery

## 2021-01-11 LAB — SARS CORONAVIRUS 2 (TAT 6-24 HRS): SARS Coronavirus 2: NEGATIVE

## 2021-01-12 ENCOUNTER — Inpatient Hospital Stay (HOSPITAL_COMMUNITY): Payer: BC Managed Care – PPO

## 2021-01-12 ENCOUNTER — Inpatient Hospital Stay (HOSPITAL_COMMUNITY): Payer: BC Managed Care – PPO | Admitting: Vascular Surgery

## 2021-01-12 ENCOUNTER — Inpatient Hospital Stay (HOSPITAL_COMMUNITY)
Admission: RE | Admit: 2021-01-12 | Discharge: 2021-01-13 | DRG: 460 | Disposition: A | Discharge status: Home / Self Care | Payer: BC Managed Care – PPO | Attending: Neurological Surgery | Admitting: Neurological Surgery

## 2021-01-12 ENCOUNTER — Encounter (HOSPITAL_COMMUNITY)
Admission: RE | Disposition: A | Discharge status: Home / Self Care | Payer: Self-pay | Source: Home / Self Care | Attending: Neurological Surgery

## 2021-01-12 ENCOUNTER — Inpatient Hospital Stay (HOSPITAL_COMMUNITY): Payer: BC Managed Care – PPO | Admitting: Anesthesiology

## 2021-01-12 ENCOUNTER — Encounter (HOSPITAL_COMMUNITY): Payer: Self-pay | Admitting: Neurological Surgery

## 2021-01-12 DIAGNOSIS — F17210 Nicotine dependence, cigarettes, uncomplicated: Secondary | ICD-10-CM | POA: Diagnosis present

## 2021-01-12 DIAGNOSIS — F419 Anxiety disorder, unspecified: Secondary | ICD-10-CM | POA: Diagnosis present

## 2021-01-12 DIAGNOSIS — M4316 Spondylolisthesis, lumbar region: Secondary | ICD-10-CM | POA: Diagnosis present

## 2021-01-12 DIAGNOSIS — F32A Depression, unspecified: Secondary | ICD-10-CM | POA: Diagnosis present

## 2021-01-12 DIAGNOSIS — M1990 Unspecified osteoarthritis, unspecified site: Secondary | ICD-10-CM | POA: Diagnosis present

## 2021-01-12 DIAGNOSIS — M48061 Spinal stenosis, lumbar region without neurogenic claudication: Secondary | ICD-10-CM | POA: Diagnosis present

## 2021-01-12 DIAGNOSIS — K219 Gastro-esophageal reflux disease without esophagitis: Secondary | ICD-10-CM | POA: Diagnosis present

## 2021-01-12 DIAGNOSIS — Z833 Family history of diabetes mellitus: Secondary | ICD-10-CM

## 2021-01-12 DIAGNOSIS — Z419 Encounter for procedure for purposes other than remedying health state, unspecified: Secondary | ICD-10-CM

## 2021-01-12 DIAGNOSIS — Z818 Family history of other mental and behavioral disorders: Secondary | ICD-10-CM | POA: Diagnosis not present

## 2021-01-12 DIAGNOSIS — Z8249 Family history of ischemic heart disease and other diseases of the circulatory system: Secondary | ICD-10-CM

## 2021-01-12 DIAGNOSIS — Z83438 Family history of other disorder of lipoprotein metabolism and other lipidemia: Secondary | ICD-10-CM | POA: Diagnosis not present

## 2021-01-12 DIAGNOSIS — M5416 Radiculopathy, lumbar region: Secondary | ICD-10-CM | POA: Diagnosis present

## 2021-01-12 HISTORY — PX: TRANSFORAMINAL LUMBAR INTERBODY FUSION W/ MIS 1 LEVEL: SHX6145

## 2021-01-12 LAB — ABO/RH: ABO/RH(D): O POS

## 2021-01-12 SURGERY — MINIMALLY INVASIVE (MIS) TRANSFORAMINAL LUMBAR INTERBODY FUSION (TLIF) 1 LEVEL
Anesthesia: General

## 2021-01-12 MED ORDER — ONDANSETRON HCL 4 MG/2ML IJ SOLN
INTRAMUSCULAR | Status: AC
  Filled 2021-01-12: qty 2

## 2021-01-12 MED ORDER — PROPOFOL 10 MG/ML IV BOLUS
INTRAVENOUS | Status: AC
  Filled 2021-01-12: qty 40

## 2021-01-12 MED ORDER — MIRABEGRON ER 50 MG PO TB24
50.0000 mg | ORAL_TABLET | Freq: Every day | ORAL | Status: DC
  Administered 2021-01-12 – 2021-01-13 (×2): 50 mg via ORAL
  Filled 2021-01-12 (×2): qty 1

## 2021-01-12 MED ORDER — HYDROMORPHONE HCL 1 MG/ML IJ SOLN
INTRAMUSCULAR | Status: AC
  Filled 2021-01-12: qty 1

## 2021-01-12 MED ORDER — GABAPENTIN 300 MG PO CAPS
300.0000 mg | ORAL_CAPSULE | Freq: Three times a day (TID) | ORAL | Status: DC
  Administered 2021-01-12 – 2021-01-13 (×3): 300 mg via ORAL
  Filled 2021-01-12 (×3): qty 1

## 2021-01-12 MED ORDER — CYCLOBENZAPRINE HCL 10 MG PO TABS
10.0000 mg | ORAL_TABLET | Freq: Three times a day (TID) | ORAL | Status: DC | PRN
  Administered 2021-01-12 (×2): 10 mg via ORAL
  Filled 2021-01-12: qty 1

## 2021-01-12 MED ORDER — OXYCODONE HCL 5 MG PO TABS
10.0000 mg | ORAL_TABLET | ORAL | Status: DC | PRN
  Administered 2021-01-12 – 2021-01-13 (×6): 10 mg via ORAL
  Filled 2021-01-12 (×6): qty 2

## 2021-01-12 MED ORDER — SUGAMMADEX SODIUM 200 MG/2ML IV SOLN
INTRAVENOUS | Status: DC | PRN
  Administered 2021-01-12: 200 mg via INTRAVENOUS

## 2021-01-12 MED ORDER — ACETAMINOPHEN 10 MG/ML IV SOLN
INTRAVENOUS | Status: AC
  Administered 2021-01-12: 1000 mg
  Filled 2021-01-12: qty 100

## 2021-01-12 MED ORDER — LIDOCAINE-EPINEPHRINE 1 %-1:100000 IJ SOLN
INTRAMUSCULAR | Status: AC
  Filled 2021-01-12: qty 1

## 2021-01-12 MED ORDER — OXYCODONE HCL 5 MG PO TABS: 5.0000 mg | ORAL_TABLET | ORAL | Status: DC | PRN

## 2021-01-12 MED ORDER — CHLORHEXIDINE GLUCONATE CLOTH 2 % EX PADS: 6.0000 | MEDICATED_PAD | Freq: Once | CUTANEOUS | Status: DC

## 2021-01-12 MED ORDER — ONDANSETRON HCL 4 MG/2ML IJ SOLN
INTRAMUSCULAR | Status: DC | PRN
  Administered 2021-01-12: 4 mg via INTRAVENOUS

## 2021-01-12 MED ORDER — PHENYLEPHRINE HCL-NACL 20-0.9 MG/250ML-% IV SOLN
INTRAVENOUS | Status: DC | PRN
  Administered 2021-01-12: 50 ug/min via INTRAVENOUS

## 2021-01-12 MED ORDER — LIDOCAINE 2% (20 MG/ML) 5 ML SYRINGE
INTRAMUSCULAR | Status: DC | PRN
Start: 2021-01-12 — End: 2021-01-12
  Administered 2021-01-12: 60 mg via INTRAVENOUS

## 2021-01-12 MED ORDER — ACETAMINOPHEN 10 MG/ML IV SOLN: 1000.0000 mg | Freq: Once | INTRAVENOUS | Status: DC

## 2021-01-12 MED ORDER — SODIUM CHLORIDE 0.9 % IV SOLN
250.0000 mL | INTRAVENOUS | Status: DC
  Administered 2021-01-12: 250 mL via INTRAVENOUS

## 2021-01-12 MED ORDER — MIDAZOLAM HCL 5 MG/5ML IJ SOLN
INTRAMUSCULAR | Status: DC | PRN
Start: 2021-01-12 — End: 2021-01-12
  Administered 2021-01-12: 2 mg via INTRAVENOUS

## 2021-01-12 MED ORDER — DOCUSATE SODIUM 100 MG PO CAPS
100.0000 mg | ORAL_CAPSULE | Freq: Two times a day (BID) | ORAL | Status: DC
  Administered 2021-01-12 – 2021-01-13 (×3): 100 mg via ORAL
  Filled 2021-01-12 (×2): qty 1

## 2021-01-12 MED ORDER — PHENYLEPHRINE HCL (PRESSORS) 10 MG/ML IV SOLN
INTRAVENOUS | Status: AC
  Filled 2021-01-12: qty 2

## 2021-01-12 MED ORDER — BUPROPION HCL ER (SR) 150 MG PO TB12
150.0000 mg | ORAL_TABLET | Freq: Two times a day (BID) | ORAL | Status: DC
  Administered 2021-01-12 – 2021-01-13 (×2): 150 mg via ORAL
  Filled 2021-01-12 (×2): qty 1

## 2021-01-12 MED ORDER — CEFAZOLIN SODIUM-DEXTROSE 2-4 GM/100ML-% IV SOLN
2.0000 g | INTRAVENOUS | Status: AC
  Administered 2021-01-12: 2 g via INTRAVENOUS
  Filled 2021-01-12: qty 100

## 2021-01-12 MED ORDER — DEXAMETHASONE SODIUM PHOSPHATE 10 MG/ML IJ SOLN
INTRAMUSCULAR | Status: AC
  Filled 2021-01-12: qty 1

## 2021-01-12 MED ORDER — EPHEDRINE SULFATE-NACL 50-0.9 MG/10ML-% IV SOSY
PREFILLED_SYRINGE | INTRAVENOUS | Status: DC | PRN
  Administered 2021-01-12 (×2): 10 mg via INTRAVENOUS

## 2021-01-12 MED ORDER — SODIUM CHLORIDE 0.9% FLUSH: 3.0000 mL | INTRAVENOUS | Status: DC | PRN

## 2021-01-12 MED ORDER — CYCLOBENZAPRINE HCL 10 MG PO TABS
ORAL_TABLET | ORAL | Status: AC
  Filled 2021-01-12: qty 1

## 2021-01-12 MED ORDER — LORAZEPAM 0.5 MG PO TABS: 1.0000 mg | ORAL_TABLET | Freq: Three times a day (TID) | ORAL | Status: DC | PRN

## 2021-01-12 MED ORDER — LACTATED RINGERS IV SOLN: INTRAVENOUS | Status: DC | PRN

## 2021-01-12 MED ORDER — 0.9 % SODIUM CHLORIDE (POUR BTL) OPTIME
TOPICAL | Status: DC | PRN
  Administered 2021-01-12: 1000 mL

## 2021-01-12 MED ORDER — LIDOCAINE 2% (20 MG/ML) 5 ML SYRINGE
INTRAMUSCULAR | Status: AC
  Filled 2021-01-12: qty 5

## 2021-01-12 MED ORDER — HYDROMORPHONE HCL 1 MG/ML IJ SOLN: 1.0000 mg | INTRAMUSCULAR | Status: DC | PRN

## 2021-01-12 MED ORDER — POLYVINYL ALCOHOL 1.4 % OP SOLN: 1.0000 [drp] | Freq: Three times a day (TID) | OPHTHALMIC | Status: DC | PRN

## 2021-01-12 MED ORDER — CHLORHEXIDINE GLUCONATE 0.12 % MT SOLN
OROMUCOSAL | Status: AC
  Filled 2021-01-12: qty 15

## 2021-01-12 MED ORDER — PAROXETINE HCL 20 MG PO TABS
20.0000 mg | ORAL_TABLET | Freq: Every day | ORAL | Status: DC
  Administered 2021-01-13: 20 mg via ORAL
  Filled 2021-01-12: qty 1

## 2021-01-12 MED ORDER — ACETAMINOPHEN 650 MG RE SUPP: 650.0000 mg | RECTAL | Status: DC | PRN

## 2021-01-12 MED ORDER — DEXAMETHASONE SODIUM PHOSPHATE 4 MG/ML IJ SOLN
INTRAMUSCULAR | Status: DC | PRN
  Administered 2021-01-12: 10 mg via INTRAVENOUS

## 2021-01-12 MED ORDER — ROCURONIUM BROMIDE 10 MG/ML (PF) SYRINGE
PREFILLED_SYRINGE | INTRAVENOUS | Status: DC | PRN
  Administered 2021-01-12: 50 mg via INTRAVENOUS
  Administered 2021-01-12: 20 mg via INTRAVENOUS
  Administered 2021-01-12: 50 mg via INTRAVENOUS
  Administered 2021-01-12: 30 mg via INTRAVENOUS

## 2021-01-12 MED ORDER — SUMATRIPTAN SUCCINATE 50 MG PO TABS
50.0000 mg | ORAL_TABLET | ORAL | Status: DC | PRN
  Filled 2021-01-12: qty 1

## 2021-01-12 MED ORDER — MIDAZOLAM HCL 2 MG/2ML IJ SOLN
INTRAMUSCULAR | Status: AC
  Filled 2021-01-12: qty 2

## 2021-01-12 MED ORDER — AMISULPRIDE (ANTIEMETIC) 5 MG/2ML IV SOLN: 10.0000 mg | Freq: Once | INTRAVENOUS | Status: DC | PRN

## 2021-01-12 MED ORDER — PHENOL 1.4 % MT LIQD: 1.0000 | OROMUCOSAL | Status: DC | PRN

## 2021-01-12 MED ORDER — SODIUM CHLORIDE 0.9% FLUSH
3.0000 mL | Freq: Two times a day (BID) | INTRAVENOUS | Status: DC
  Administered 2021-01-12 (×2): 3 mL via INTRAVENOUS

## 2021-01-12 MED ORDER — ACETAMINOPHEN 325 MG PO TABS
650.0000 mg | ORAL_TABLET | ORAL | Status: DC | PRN
  Administered 2021-01-12 – 2021-01-13 (×3): 650 mg via ORAL
  Filled 2021-01-12 (×3): qty 2

## 2021-01-12 MED ORDER — THROMBIN 5000 UNITS EX SOLR
OROMUCOSAL | Status: DC | PRN
  Administered 2021-01-12: 5 mL via TOPICAL

## 2021-01-12 MED ORDER — ONDANSETRON HCL 4 MG/2ML IJ SOLN
4.0000 mg | Freq: Four times a day (QID) | INTRAMUSCULAR | Status: DC | PRN
Start: 2021-01-12 — End: 2021-01-13

## 2021-01-12 MED ORDER — FENTANYL CITRATE (PF) 100 MCG/2ML IJ SOLN
INTRAMUSCULAR | Status: DC | PRN
  Administered 2021-01-12: 25 ug via INTRAVENOUS
  Administered 2021-01-12: 100 ug via INTRAVENOUS
  Administered 2021-01-12: 25 ug via INTRAVENOUS
  Administered 2021-01-12: 50 ug via INTRAVENOUS

## 2021-01-12 MED ORDER — EPHEDRINE 5 MG/ML INJ
INTRAVENOUS | Status: AC
  Filled 2021-01-12: qty 5

## 2021-01-12 MED ORDER — HYDROMORPHONE HCL 1 MG/ML IJ SOLN
0.5000 mg | INTRAMUSCULAR | Status: DC | PRN
  Administered 2021-01-12: 0.5 mg via INTRAVENOUS

## 2021-01-12 MED ORDER — CEFAZOLIN SODIUM-DEXTROSE 2-4 GM/100ML-% IV SOLN
2.0000 g | Freq: Three times a day (TID) | INTRAVENOUS | Status: AC
  Administered 2021-01-12 (×2): 2 g via INTRAVENOUS
  Filled 2021-01-12 (×2): qty 100

## 2021-01-12 MED ORDER — PHENYLEPHRINE 40 MCG/ML (10ML) SYRINGE FOR IV PUSH (FOR BLOOD PRESSURE SUPPORT)
PREFILLED_SYRINGE | INTRAVENOUS | Status: DC | PRN
  Administered 2021-01-12 (×2): 80 ug via INTRAVENOUS
  Administered 2021-01-12: 40 ug via INTRAVENOUS
  Administered 2021-01-12 (×2): 80 ug via INTRAVENOUS

## 2021-01-12 MED ORDER — ROCURONIUM BROMIDE 100 MG/10ML IV SOLN: INTRAVENOUS | Status: DC | PRN

## 2021-01-12 MED ORDER — ONDANSETRON HCL 4 MG PO TABS: 4.0000 mg | ORAL_TABLET | Freq: Four times a day (QID) | ORAL | Status: DC | PRN

## 2021-01-12 MED ORDER — LIDOCAINE-EPINEPHRINE 1 %-1:100000 IJ SOLN
INTRAMUSCULAR | Status: DC | PRN
  Administered 2021-01-12: 10 mL

## 2021-01-12 MED ORDER — FENTANYL CITRATE (PF) 250 MCG/5ML IJ SOLN
INTRAMUSCULAR | Status: AC
  Filled 2021-01-12: qty 5

## 2021-01-12 MED ORDER — PROPOFOL 10 MG/ML IV BOLUS
INTRAVENOUS | Status: DC | PRN
  Administered 2021-01-12: 110 mg via INTRAVENOUS

## 2021-01-12 MED ORDER — TRETINOIN MICROSPHERE PUMP 0.1 % EX GEL: 1.0000 | CUTANEOUS | Status: DC

## 2021-01-12 MED ORDER — ROCURONIUM BROMIDE 10 MG/ML (PF) SYRINGE
PREFILLED_SYRINGE | INTRAVENOUS | Status: AC
  Filled 2021-01-12: qty 10

## 2021-01-12 MED ORDER — MENTHOL 3 MG MT LOZG: 1.0000 | LOZENGE | OROMUCOSAL | Status: DC | PRN

## 2021-01-12 MED ORDER — THROMBIN 5000 UNITS EX SOLR
CUTANEOUS | Status: AC
  Filled 2021-01-12: qty 5000

## 2021-01-12 MED ORDER — POLYETHYLENE GLYCOL 3350 17 G PO PACK: 17.0000 g | PACK | Freq: Every day | ORAL | Status: DC | PRN

## 2021-01-12 MED ORDER — HYDROMORPHONE HCL 1 MG/ML IJ SOLN
0.2500 mg | INTRAMUSCULAR | Status: DC | PRN
  Administered 2021-01-12 (×4): 0.5 mg via INTRAVENOUS

## 2021-01-12 MED ORDER — MEPERIDINE HCL 25 MG/ML IJ SOLN: 6.2500 mg | INTRAMUSCULAR | Status: DC | PRN

## 2021-01-12 SURGICAL SUPPLY — 60 items
BAG COUNTER SPONGE SURGICOUNT (BAG) ×4 IMPLANT
BAND RUBBER #18 3X1/16 STRL (MISCELLANEOUS) ×4 IMPLANT
BASKET BONE COLLECTION (BASKET) ×2 IMPLANT
BLADE CLIPPER SURG (BLADE) IMPLANT
BLADE SURG 11 STRL SS (BLADE) ×2 IMPLANT
BUR MATCHSTICK NEURO 3.0 LAGG (BURR) IMPLANT
BUR PRECISION FLUTE 5.0 (BURR) ×2 IMPLANT
BUR PRECISION MATCH 3.0 13 (BURR) IMPLANT
CAGE INTERBODY PL SHT 7X22.5 (Plate) ×2 IMPLANT
CNTNR URN SCR LID CUP LEK RST (MISCELLANEOUS) ×1 IMPLANT
CONT SPEC 4OZ STRL OR WHT (MISCELLANEOUS) ×1
COVER BACK TABLE 60X90IN (DRAPES) ×2 IMPLANT
DECANTER SPIKE VIAL GLASS SM (MISCELLANEOUS) ×2 IMPLANT
DERMABOND ADVANCED (GAUZE/BANDAGES/DRESSINGS) ×1
DERMABOND ADVANCED .7 DNX12 (GAUZE/BANDAGES/DRESSINGS) ×1 IMPLANT
DRAPE C-ARM 42X72 X-RAY (DRAPES) ×2 IMPLANT
DRAPE C-ARMOR (DRAPES) ×2 IMPLANT
DRAPE LAPAROTOMY 100X72X124 (DRAPES) ×2 IMPLANT
DRAPE MICROSCOPE LEICA (MISCELLANEOUS) ×2 IMPLANT
DRAPE SURG 17X23 STRL (DRAPES) ×4 IMPLANT
ELECT BLADE 6.5 EXT (BLADE) ×2 IMPLANT
ELECT REM PT RETURN 9FT ADLT (ELECTROSURGICAL) ×2
ELECTRODE REM PT RTRN 9FT ADLT (ELECTROSURGICAL) ×1 IMPLANT
EXTENDER TAB GUIDE SV 5.5/6.0 (INSTRUMENTS) ×16 IMPLANT
GAUZE 4X4 16PLY ~~LOC~~+RFID DBL (SPONGE) ×2 IMPLANT
GAUZE SPONGE 4X4 12PLY STRL (GAUZE/BANDAGES/DRESSINGS) ×2 IMPLANT
GLOVE SURG LTX SZ7 (GLOVE) ×2 IMPLANT
GLOVE SURG LTX SZ7.5 (GLOVE) ×2 IMPLANT
GLOVE SURG UNDER POLY LF SZ7.5 (GLOVE) ×4 IMPLANT
GOWN STRL REUS W/ TWL LRG LVL3 (GOWN DISPOSABLE) ×2 IMPLANT
GOWN STRL REUS W/ TWL XL LVL3 (GOWN DISPOSABLE) ×2 IMPLANT
GOWN STRL REUS W/TWL 2XL LVL3 (GOWN DISPOSABLE) IMPLANT
GOWN STRL REUS W/TWL LRG LVL3 (GOWN DISPOSABLE) ×2
GOWN STRL REUS W/TWL XL LVL3 (GOWN DISPOSABLE) ×2
GUIDEWIRE BLUNT NT 450 (WIRE) ×8 IMPLANT
HEMOSTAT POWDER KIT SURGIFOAM (HEMOSTASIS) ×2 IMPLANT
KIT BASIN OR (CUSTOM PROCEDURE TRAY) ×2 IMPLANT
KIT POSITION SURG JACKSON T1 (MISCELLANEOUS) ×2 IMPLANT
KIT TURNOVER KIT B (KITS) ×2 IMPLANT
NEEDLE BEVEL TWO-PAK W/1PK (NEEDLE) ×2 IMPLANT
NEEDLE HYPO 18GX1.5 BLUNT FILL (NEEDLE) IMPLANT
NEEDLE HYPO 22GX1.5 SAFETY (NEEDLE) ×2 IMPLANT
NEEDLE SPNL 18GX3.5 QUINCKE PK (NEEDLE) IMPLANT
NS IRRIG 1000ML POUR BTL (IV SOLUTION) ×2 IMPLANT
PACK LAMINECTOMY NEURO (CUSTOM PROCEDURE TRAY) ×2 IMPLANT
PAD ARMBOARD 7.5X6 YLW CONV (MISCELLANEOUS) ×4 IMPLANT
ROD 5.5 CCM PERC 40 (Rod) ×4 IMPLANT
SCREW MAS VOYAGER 6.5X35 (Screw) ×8 IMPLANT
SCREW SET 5.5/6.0MM SOLERA (Screw) ×8 IMPLANT
SPONGE T-LAP 4X18 ~~LOC~~+RFID (SPONGE) ×4 IMPLANT
SUT MNCRL AB 3-0 PS2 18 (SUTURE) ×2 IMPLANT
SUT MNCRL AB 3-0 PS2 27 (SUTURE) ×2 IMPLANT
SUT VIC AB 0 CT1 18XCR BRD8 (SUTURE) IMPLANT
SUT VIC AB 0 CT1 8-18 (SUTURE)
SUT VIC AB 2-0 CP2 18 (SUTURE) ×2 IMPLANT
SYR 3ML LL SCALE MARK (SYRINGE) IMPLANT
TOWEL GREEN STERILE (TOWEL DISPOSABLE) ×2 IMPLANT
TOWEL GREEN STERILE FF (TOWEL DISPOSABLE) ×2 IMPLANT
TRAY FOLEY MTR SLVR 16FR STAT (SET/KITS/TRAYS/PACK) ×2 IMPLANT
WATER STERILE IRR 1000ML POUR (IV SOLUTION) ×2 IMPLANT

## 2021-01-12 NOTE — Anesthesia Procedure Notes (Signed)
Procedure Name: Intubation Date/Time: 01/12/2021 7:37 AM Performed by: Lieutenant Diego, CRNA Pre-anesthesia Checklist: Patient identified, Emergency Drugs available, Suction available and Patient being monitored Patient Re-evaluated:Patient Re-evaluated prior to induction Oxygen Delivery Method: Circle system utilized Preoxygenation: Pre-oxygenation with 100% oxygen Induction Type: IV induction Ventilation: Mask ventilation without difficulty Laryngoscope Size: Miller and 2 Grade View: Grade I Tube type: Oral Tube size: 7.0 mm Number of attempts: 1 Airway Equipment and Method: Stylet Placement Confirmation: ETT inserted through vocal cords under direct vision, positive ETCO2 and breath sounds checked- equal and bilateral Secured at: 22 cm Tube secured with: Tape Dental Injury: Teeth and Oropharynx as per pre-operative assessment

## 2021-01-12 NOTE — H&P (Signed)
Surgical H&P Update  HPI: 60 y.o. woman with h/o back and BLE radicular pain with numbness and weakness. 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 Her symptoms were uncontrolled with nonsurgical treatment and she developed some right foot weakness so I recommended surgical treatment. Still having symptoms and wishes to proceed with surgery.  PMHx:  Past Medical History:  Diagnosis Date   Anxiety    Arthritis    "top of my neck, spine, right knee" (04/04/2013)   Depression    GERD (gastroesophageal reflux disease)    Migraines    "last one was ~ 5 yr ago" (04/04/2013)   Shortness of breath    "just related to my heart issues right now" (04/04/2013)   Urinary incontinence    FamHx:  Family History  Problem Relation Age of Onset   Heart disease Mother    Hyperlipidemia Mother    Hypertension Mother    Hypertension Father    AAA (abdominal aortic aneurysm) Brother    Diabetes Brother    Depression Brother    Healthy Brother    SocHx:  reports that she has been smoking cigarettes. She has a 15.00 pack-year smoking history. She has never used smokeless tobacco. She reports that she does not drink alcohol and does not use drugs.  Physical Exam: AOx3, PERRL, FS, TM  Strength 5/5 x4 except 4+/5 in R DF, SILTx4  Assesment/Plan: 60 y.o. woman with L4-5 mobile spondy, here for MIS TLIF. Risks, benefits, and alternatives discussed and the patient would like to continue with surgery.  -OR today -3C post-op  Judith Part, MD 01/12/21 7:18 AM

## 2021-01-12 NOTE — Transfer of Care (Signed)
Immediate Anesthesia Transfer of Care Note  Patient: Kristy Butler  Procedure(s) Performed: Lumbar four-five Minimally invasive transforaminal lumbar interbody fusion  Patient Location: PACU  Anesthesia Type:General  Level of Consciousness: awake  Airway & Oxygen Therapy: Patient Spontanous Breathing and Patient connected to face mask oxygen  Post-op Assessment: Report given to RN and Post -op Vital signs reviewed and stable  Post vital signs: Reviewed and stable  Last Vitals:  Vitals Value Taken Time  BP 157/83 01/12/21 1041  Temp    Pulse 84 01/12/21 1044  Resp 11 01/12/21 1044  SpO2 100 % 01/12/21 1044  Vitals shown include unvalidated device data.  Last Pain:  Vitals:   01/12/21 0618  TempSrc:   PainSc: 7       Patients Stated Pain Goal: 5 ([id number])  Complications: No notable events documented.

## 2021-01-12 NOTE — Anesthesia Postprocedure Evaluation (Signed)
Anesthesia Post Note  Patient: Kristy Butler  Procedure(s) Performed: Lumbar four-five Minimally invasive transforaminal lumbar interbody fusion     Patient location during evaluation: PACU Anesthesia Type: General Level of consciousness: sedated and patient cooperative Pain management: pain level controlled Vital Signs Assessment: post-procedure vital signs reviewed and stable Respiratory status: spontaneous breathing Cardiovascular status: stable Anesthetic complications: no   No notable events documented.  Last Vitals:  Vitals:   01/12/21 1305 01/12/21 1544  BP: 133/83 115/68  Pulse: 75 63  Resp: 20 18  Temp: 36.6 C 36.8 C  SpO2: 99% 93%    Last Pain:  Vitals:   01/12/21 1544  TempSrc: Oral  PainSc:                  Nolon Nations

## 2021-01-12 NOTE — Op Note (Signed)
PATIENT: Kristy Butler  DAY OF SURGERY: 01/12/21   PRE-OPERATIVE DIAGNOSIS:  Lumbar radiculopathy, lumbar spondylolisthesis   POST-OPERATIVE DIAGNOSIS:  Same   PROCEDURE:  Right L4-L5 minimally invasive transforaminal lumbar interbody fusion with bilateral L4-L5 pedicle screw placement   SURGEON:  Surgeon(s) and Role:    Judith Part, MD - Primary    Consuella Lose, MD - Assisting   ANESTHESIA: ETGA   BRIEF HISTORY: This is a 60 year old who presented with refractory low back and bilateral right greater than left lower extremity pain with some right foot weakness. The patient was found to have a mobile spondylolisthesis at L4-5 with foraminal stenosis. This was discussed with the patient as well as risks, benefits, and alternatives and wished to proceed with surgery.   OPERATIVE DETAIL:  The patient was taken to the operating room and placed on the OR table in the prone position. A formal time out was performed with two patient identifiers and confirmed the operative site. Anesthesia was induced by the anesthesia team. The operative site was marked, hair was clipped with surgical clippers, the area was then prepped and draped in a sterile fashion.   Fluoroscopy was used to localize the surgical level. The pedicles were marked and used to create skin incisions bilaterally. With fluoro guidance, Jamshidi needles were used to guide K-wires into the bilateral L4 and L5 pedicles. The K wires were then secured with hemostats and attention turned to the TLIF.  A [employer] tube was then docked to the right L4-5 facet through the same incision using fluoroscopy. A right L4-5 facetectomy was performed and the right L4 nerve root was decompressed along its entire course. The disc space was identified, incised, and a discectomy was performed in the standard fashion. The endplates were prepped, bone graft was packed into the disc space, and an expandable cage ([employer]) was packed with autograft  and placed into the disc space with fluoroscopic confirmation. The tube was removed and hemostasis was obtained during its removal.   Using the previously placed K wires, a tap and then screw with tower were placed bilaterally at L4 and L5. A rod was sized and introduced on both sides, confirmed with fluoroscopy, then final tightened. Hemostasis was again confirmed for both incisions, they were copiously irrigated, and then closed in layers.    EBL:  43m   DRAINS: none   SPECIMENS: none   TJudith Part MD 01/12/21 10:43 AM

## 2021-01-13 MED ORDER — OXYCODONE HCL 5 MG PO TABS: 5.0000 mg | ORAL_TABLET | ORAL | 0 refills | Status: DC | PRN

## 2021-01-13 MED ORDER — CYCLOBENZAPRINE HCL 10 MG PO TABS: 10.0000 mg | ORAL_TABLET | Freq: Three times a day (TID) | ORAL | 0 refills | Status: DC | PRN

## 2021-01-13 NOTE — Evaluation (Signed)
Occupational Therapy Evaluation Patient Details Name: Kristy Butler MRN: [id number] DOB: 02-21-1961 Today's Date: 01/13/2021    History of Present Illness Pt is a 60 y/o female who presents s/p L4-5 TLIF on 01/12/2021. PMH significant for migraines, urinary incontinence, joint replacement.   Clinical Impression   Ms. Karitza Butler is a 60 year old woman s/p spinal surgery who presents with pain and spinal precautions. Patient provided with reiteration of spinal precautions. Patient exhibited ability to perform functional mobility and ADLs without breaking spinal precautions. Patient reports having assistance of daughter at home and has LHR if needed. Instructed patient on how to perform ADLs using compensatory strategies as needed. Patient verbalized understanding. No further OT needs.      Follow Up Recommendations  No OT follow up    Equipment Recommendations  None recommended by OT    Recommendations for Other Services       Precautions / Restrictions Precautions Precautions: Back Precaution Booklet Issued: Yes (comment) Restrictions Weight Bearing Restrictions: No      Mobility Bed Mobility Overal bed mobility: Modified Independent             General bed mobility comments: Demonstrated ability to perform log roll technique in and out of bed.    Transfers Overall transfer level: Modified independent Equipment used: None             General transfer comment: Supervision to ambulate in room and perform toilet transfer.    Balance Overall balance assessment: Mild deficits observed, not formally tested                                         ADL either performed or assessed with clinical judgement   ADL Overall ADL's : Modified independent                                       General ADL Comments: Demonstrated ability to perform all ADLs using compensatory strategies or altered positioning.     Vision Patient Visual  Report: No change from baseline       Perception     Praxis      Pertinent Vitals/Pain Pain Assessment: Faces Faces Pain Scale: Hurts a little bit Pain Descriptors / Indicators: Sore Pain Intervention(s): Limited activity within patient's tolerance     Hand Dominance     Extremity/Trunk Assessment Upper Extremity Assessment Upper Extremity Assessment: Overall WFL for tasks assessed (deferred MMT)           Communication Communication Communication: No difficulties   Cognition Arousal/Alertness: Awake/alert Behavior During Therapy: WFL for tasks assessed/performed Overall Cognitive Status: Within Functional Limits for tasks assessed                                     General Comments       Exercises     Shoulder Instructions      Home Living Family/patient expects to be discharged to:: Private residence Living Arrangements: Alone Available Help at Discharge: Family;Available PRN/intermittently Type of Home: House             Bathroom Shower/Tub: Teacher, early years/pre: Handicapped height  Prior Functioning/Environment Level of Independence: Independent                 OT Problem List: Decreased knowledge of use of DME or AE;Decreased knowledge of precautions;Pain      OT Treatment/Interventions:      OT Goals(Current goals can be found in the care plan section) Acute Rehab OT Goals OT Goal Formulation: All assessment and education complete, DC therapy  OT Frequency:     Barriers to D/C:            Co-evaluation              AM-PAC OT "6 Clicks" Daily Activity     Outcome Measure Help from another person eating meals?: None Help from another person taking care of personal grooming?: None Help from another person toileting, which includes using toliet, bedpan, or urinal?: None Help from another person bathing (including washing, rinsing, drying)?: None Help from another person to  put on and taking off regular upper body clothing?: None Help from another person to put on and taking off regular lower body clothing?: None 6 Click Score: 24   End of Session Nurse Communication: Mobility status  Activity Tolerance: Patient tolerated treatment well Patient left: in bed;with call bell/phone within reach  OT Visit Diagnosis: Pain                Time: [id number] OT Time Calculation (min): 17 min Charges:  OT General Charges $OT Visit: 1 Visit OT Evaluation $OT Eval Low Complexity: 1 Low  Tanaka Gillen, OTR/L [place]  Office [phone] Pager: [place] 01/13/2021, 10:05 AM

## 2021-01-13 NOTE — Plan of Care (Signed)
Adequately ready for discharge 

## 2021-01-13 NOTE — Progress Notes (Signed)
Patient alert and oriented, voiding adequately, skin clean, dry and intact without evidence of skin break down, or symptoms of complications - no redness or edema noted, only slight tenderness at site.  Patient states pain is manageable at time of discharge. Patient has an appointment with MD in 2 weeks 

## 2021-01-13 NOTE — Progress Notes (Signed)
Neurosurgery Service Progress Note  Subjective: No acute events overnight, preop leg pain gone, she's very happy   Objective: Vitals:   01/12/21 1916 01/12/21 2317 01/13/21 0310 01/13/21 0710  BP: 118/65 129/79 138/88 (!) 158/81  Pulse: 73 74 77 74  Resp: '18 18 18 18  '$ Temp: 98.1 F (36.7 C) 97.8 F (36.6 C) 98.1 F (36.7 C) 98.4 F (36.9 C)  TempSrc: Oral Oral Oral Oral  SpO2: 95% 98% 98% 97%  Weight:      Height:        Physical Exam: Strength 5/5 x4, SILTx4 Incisions c/d/i  Assessment & Plan: 60 y.o. woman s/p MIS TLIF, recovering well.  -discharge home today  Judith Part  01/13/21 8:47 AM

## 2021-01-13 NOTE — Discharge Summary (Signed)
Discharge Summary  Date of Admission: 01/12/2021  Date of Discharge: 01/13/21  Attending Physician: Emelda Brothers, MD  Hospital Course: Patient was admitted following an uncomplicated [id number] MIS TLIF. She was recovered in PACU and transferred to [facility]. Her hospital course was uncomplicated and the patient was discharged home on 01/13/21. She will follow up in clinic with me in 2 weeks.  Neurologic exam at discharge:  Strength 5/5 x4, SILTx4  Discharge diagnosis: Lumbar spondylolisthesis, lumbar radiculopathy  Judith Part, MD 01/13/21 8:51 AM

## 2021-01-13 NOTE — Evaluation (Signed)
Physical Therapy Evaluation and Discharge Patient Details Name: Kristy Butler MRN: [id number] DOB: 03/01/1961 Today's Date: 01/13/2021   History of Present Illness  Pt is a 60 y/o female who presents s/p L4-5 TLIF on 01/12/2021. PMH significant for migraines, urinary incontinence, joint replacement.   Clinical Impression  Pt admitted with above diagnosis. At the time of PT eval, pt was able to demonstrate transfers and ambulation with gross modified independence by end of session without AD. Pt was educated on precautions, appropriate activity progression, and car transfer. Pt currently with functional limitations due to the deficits listed below (see PT Problem List). Pt will benefit from skilled PT to increase their independence and safety with mobility to allow discharge to the venue listed below.      Follow Up Recommendations No PT follow up;Supervision - Intermittent    Equipment Recommendations  None recommended by PT    Recommendations for Other Services       Precautions / Restrictions Precautions Precautions: Back Precaution Booklet Issued: Yes (comment) Restrictions Weight Bearing Restrictions: No      Mobility  Bed Mobility Overal bed mobility: Modified Independent             General bed mobility comments: HOB flat and rails lowered to simulate home environment. No assist required. Min cues for optimal log roll technique.    Transfers Overall transfer level: Modified independent Equipment used: None             General transfer comment: No assist to power up to full stand. No unsteadiness or LOB noted.  Ambulation/Gait Ambulation/Gait assistance: Modified independent (Device/Increase time);Supervision Gait Distance (Feet): 300 Feet Assistive device: None Gait Pattern/deviations: Step-through pattern;Decreased stride length;Trunk flexed Gait velocity: Decreased Gait velocity interpretation: 1.31 - 2.62 ft/sec, indicative of limited community  ambulator General Gait Details: VC's for improved posture and forward gaze. Pt holding hands folded in front of her abdomen. Cues for natural arm swing, but pt preferred this posture.  Stairs Stairs: Yes Stairs assistance: Min guard;Supervision Stair Management: One rail Left;Step to pattern;Forwards Number of Stairs: 4 General stair comments: VC's for sequencing and general safety. No assist required.  Wheelchair Mobility    Modified Rankin (Stroke Patients Only)       Balance Overall balance assessment: Mild deficits observed, not formally tested                                           Pertinent Vitals/Pain Pain Assessment: Faces Faces Pain Scale: Hurts a little bit Pain Descriptors / Indicators: Sore Pain Intervention(s): Limited activity within patient's tolerance;Monitored during session;Repositioned    Home Living Family/patient expects to be discharged to:: Private residence Living Arrangements: Alone Available Help at Discharge: Family;Available PRN/intermittently Type of Home: House Home Access: Stairs to enter   [employer] of Steps: 4 Home Layout: One level Home Equipment: Cane - single point      Prior Function Level of Independence: Independent               Hand Dominance        Extremity/Trunk Assessment   Upper Extremity Assessment Upper Extremity Assessment: Defer to OT evaluation    Lower Extremity Assessment Lower Extremity Assessment: Generalized weakness    Cervical / Trunk Assessment Cervical / Trunk Assessment: Other exceptions Cervical / Trunk Exceptions: s/p surgery  Communication   Communication: No difficulties  Cognition Arousal/Alertness:  Awake/alert Behavior During Therapy: WFL for tasks assessed/performed Overall Cognitive Status: Within Functional Limits for tasks assessed                                        General Comments      Exercises      Assessment/Plan    PT Assessment Patent does not need any further PT services  PT Problem List         PT Treatment Interventions      PT Goals (Current goals can be found in the Care Plan section)  Acute Rehab PT Goals Patient Stated Goal: Home today; get some sleep PT Goal Formulation: All assessment and education complete, DC therapy    Frequency     Barriers to discharge        Co-evaluation               AM-PAC PT "6 Clicks" Mobility  Outcome Measure Help needed turning from your back to your side while in a flat bed without using bedrails?: None Help needed moving from lying on your back to sitting on the side of a flat bed without using bedrails?: None Help needed moving to and from a bed to a chair (including a wheelchair)?: None Help needed standing up from a chair using your arms (e.g., wheelchair or bedside chair)?: None Help needed to walk in hospital room?: None Help needed climbing 3-5 steps with a railing? : A Little 6 Click Score: 23    End of Session Equipment Utilized During Treatment: Gait belt Activity Tolerance: Patient tolerated treatment well Patient left: in bed;with call bell/phone within reach Nurse Communication: Mobility status PT Visit Diagnosis: Unsteadiness on feet (R26.81);Pain Pain - part of body:  (back)    Time: [id number] PT Time Calculation (min) (ACUTE ONLY): 23 min   Charges:   PT Evaluation $PT Eval Low Complexity: 1 Low PT Treatments $Gait Training: 8-22 mins        Rolinda Roan, PT, DPT Acute Rehabilitation Services Pager: [phone] Office: [phone]   Thelma Comp 01/13/2021, 12:10 PM

## 2021-01-14 ENCOUNTER — Telehealth: Payer: Self-pay | Admitting: *Deleted

## 2021-01-14 ENCOUNTER — Encounter (HOSPITAL_COMMUNITY): Payer: Self-pay | Admitting: Neurological Surgery

## 2021-01-14 MED ORDER — TRETINOIN MICROSPHERE PUMP 0.1 % EX GEL: 1.0000 | CUTANEOUS | 3 refills | Status: AC

## 2021-01-14 NOTE — Telephone Encounter (Signed)
Received request from pharmacy for PA on Tretinoin Microsphere.   PA submitted.   Dx: L70.9- adult acne.  Received immediate determination.   PA [id number] approved 12/15/2020- 01/14/2022.

## 2021-04-05 ENCOUNTER — Other Ambulatory Visit: Payer: Self-pay | Admitting: Nurse Practitioner

## 2021-04-05 DIAGNOSIS — Z1231 Encounter for screening mammogram for malignant neoplasm of breast: Secondary | ICD-10-CM

## 2021-04-06 ENCOUNTER — Ambulatory Visit
Admission: RE | Admit: 2021-04-06 | Discharge: 2021-04-06 | Disposition: A | Discharge status: Home / Self Care | Payer: BC Managed Care – PPO | Source: Ambulatory Visit | Attending: Nurse Practitioner | Admitting: Nurse Practitioner

## 2021-04-06 DIAGNOSIS — Z1231 Encounter for screening mammogram for malignant neoplasm of breast: Secondary | ICD-10-CM

## 2021-04-10 ENCOUNTER — Other Ambulatory Visit: Payer: Self-pay | Admitting: Nurse Practitioner

## 2021-04-10 DIAGNOSIS — F331 Major depressive disorder, recurrent, moderate: Secondary | ICD-10-CM

## 2021-04-10 DIAGNOSIS — F411 Generalized anxiety disorder: Secondary | ICD-10-CM

## 2021-04-12 ENCOUNTER — Other Ambulatory Visit: Payer: Self-pay

## 2021-04-12 NOTE — Telephone Encounter (Signed)
LOV 12/30/20 Last refill 12/30/20, #90, 1 refill  Please review. Thank you!

## 2021-04-20 ENCOUNTER — Other Ambulatory Visit: Payer: Self-pay | Admitting: *Deleted

## 2021-04-20 NOTE — Telephone Encounter (Signed)
Received fax requesting refill on Ativan.   Ok to refill??  Last office visit 12/30/2020.   Last refill 08/05/2020, #1 refill.

## 2021-05-07 ENCOUNTER — Other Ambulatory Visit: Payer: Self-pay

## 2021-05-07 ENCOUNTER — Other Ambulatory Visit: Payer: Self-pay | Admitting: Nurse Practitioner

## 2021-05-07 MED ORDER — MYRBETRIQ 50 MG PO TB24: ORAL_TABLET | ORAL | 3 refills | Status: AC

## 2021-05-26 ENCOUNTER — Other Ambulatory Visit: Payer: Self-pay

## 2021-05-26 NOTE — Addendum Note (Signed)
Addended by: Wadie Lessen on: 05/26/2021 04:57 PM   Modules accepted: Orders

## 2021-05-26 NOTE — Telephone Encounter (Signed)
Pt has scheduled appt for CPE 06/03/21. She states she had recent back surgery and had forgotten about scheduling an appt with you.  Please advise on refill request with upcoming appt now scheduled. Thanks!

## 2021-05-26 NOTE — Telephone Encounter (Signed)
LOV 12/30/20, requested 4 mo f/u and this has not been scheduled Last refill 08/05/20, #90, 1 refill  Please review, thanks!

## 2021-05-27 NOTE — Telephone Encounter (Signed)
Will discuss at upcoming appointment

## 2021-06-03 ENCOUNTER — Ambulatory Visit (INDEPENDENT_AMBULATORY_CARE_PROVIDER_SITE_OTHER): Payer: BC Managed Care – PPO | Admitting: Nurse Practitioner

## 2021-06-03 ENCOUNTER — Other Ambulatory Visit: Payer: Self-pay

## 2021-06-03 ENCOUNTER — Encounter: Payer: Self-pay | Admitting: Nurse Practitioner

## 2021-06-03 VITALS — BP 130/92 | HR 98 | Temp 98.8°F | Resp 17 | Ht 66.0 in | Wt 157.0 lb

## 2021-06-03 DIAGNOSIS — F331 Major depressive disorder, recurrent, moderate: Secondary | ICD-10-CM

## 2021-06-03 DIAGNOSIS — Z1329 Encounter for screening for other suspected endocrine disorder: Secondary | ICD-10-CM

## 2021-06-03 DIAGNOSIS — F411 Generalized anxiety disorder: Secondary | ICD-10-CM

## 2021-06-03 DIAGNOSIS — Z23 Encounter for immunization: Secondary | ICD-10-CM | POA: Diagnosis not present

## 2021-06-03 DIAGNOSIS — Z13220 Encounter for screening for lipoid disorders: Secondary | ICD-10-CM

## 2021-06-03 DIAGNOSIS — Z13228 Encounter for screening for other metabolic disorders: Secondary | ICD-10-CM

## 2021-06-03 DIAGNOSIS — Z0000 Encounter for general adult medical examination without abnormal findings: Secondary | ICD-10-CM

## 2021-06-03 DIAGNOSIS — Z0001 Encounter for general adult medical examination with abnormal findings: Secondary | ICD-10-CM | POA: Diagnosis not present

## 2021-06-03 DIAGNOSIS — Z130 Encounter for screening for diseases of the blood and blood-forming organs and certain disorders involving the immune mechanism: Secondary | ICD-10-CM | POA: Diagnosis not present

## 2021-06-03 DIAGNOSIS — Z1211 Encounter for screening for malignant neoplasm of colon: Secondary | ICD-10-CM

## 2021-06-03 DIAGNOSIS — Z833 Family history of diabetes mellitus: Secondary | ICD-10-CM

## 2021-06-03 DIAGNOSIS — F17200 Nicotine dependence, unspecified, uncomplicated: Secondary | ICD-10-CM

## 2021-06-03 MED ORDER — LORAZEPAM 1 MG PO TABS
1.0000 mg | ORAL_TABLET | Freq: Every day | ORAL | 0 refills | Status: AC | PRN
Start: 2021-06-03 — End: ?

## 2021-06-03 NOTE — Assessment & Plan Note (Signed)
Chronic.  She continues to cut back on smoking.  She is hoping to completely stop smoking in the new year.

## 2021-06-03 NOTE — Addendum Note (Signed)
Addended by: Jaynie Crumble on: 06/03/2021 04:53 PM   Modules accepted: Orders

## 2021-06-03 NOTE — Assessment & Plan Note (Signed)
Chronic.  PHQ-9 and GAD-7 not obtained today, however patients reports good control of mood with current medications.  Will give refill of lorazepam to use sparingly.  I discussed that this medication should not be for multiple times daily use.  Continue collaboration with therapy.  Follow up 3 months or sooner if refill needed.

## 2021-06-03 NOTE — Progress Notes (Signed)
BP (!) 130/92 (BP Location: Left Arm, Patient Position: Sitting, Cuff Size: Normal)    Pulse 98    Temp 98.8 F (37.1 C) (Temporal)    Resp 17    Ht 5\' 6"  (1.676 m)    Wt 157 lb (71.2 kg)    SpO2 97%    BMI 25.34 kg/m    Subjective:    Patient ID: Kristy Butler, female    DOB: [birth year], 60 y.o.   MRN: [id number]  HPI: Kristy Butler is a 60 y.o. female presenting on 06/03/2021 for comprehensive medical examination. Current medical complaints include:  Reports she is going to the [facility] therapy for intensive PTSD therapy.  She takes lorazepam daily before she goes.  Also taking Wellbutrin 150 mg twice daily and Paxil 20 mg daily which she thinks are working well for her.   Had back surgery earlier this year, also reports history of bursitis in bilateral hips and wondering if she can receive injection.  She currently lives with: self; 3 pets (1 dog, 2 cats) LMP: postmenopausal   The patient does not have a history of falls. I did not complete a risk assessment for falls. A plan of care for falls was not documented.   Past Medical History:  Past Medical History:  Diagnosis Date   Anxiety    Arthritis    "top of my neck, spine, right knee" (04/04/2013)   Depression    GERD (gastroesophageal reflux disease)    Migraines    "last one was ~ 5 yr ago" (04/04/2013)   Shortness of breath    "just related to my heart issues right now" (04/04/2013)   Urinary incontinence     Surgical History:  Past Surgical History:  Procedure Laterality Date   GREAT TOE ARTHRODESIS, INTERPHALANGEAL JOINT Right 2010   "replaced joint in my foot" (04/04/2013)   JOINT REPLACEMENT     LEFT HEART CATH AND CORONARY ANGIOGRAPHY N/A 10/12/2016   Procedure: Left Heart Cath and Coronary Angiography;  Surgeon: Wellington Hampshire, MD;  Location: [facility];  Service: Cardiovascular;  Laterality: N/A;   TRANSFORAMINAL LUMBAR INTERBODY FUSION W/ MIS 1 LEVEL N/A 01/12/2021   Procedure: Lumbar  four-five Minimally invasive transforaminal lumbar interbody fusion;  Surgeon: Judith Part, MD;  Location: [facility];  Service: Neurosurgery;  Laterality: N/A;   TUBAL LIGATION  ~ [place] Bilateral ~ 1990    Medications:  Current Outpatient Medications on File Prior to Visit  Medication Sig   buPROPion (WELLBUTRIN SR) 150 MG 12 hr tablet TAKE 1 TABLET(150 MG) BY MOUTH TWICE DAILY   cyclobenzaprine (FLEXERIL) 10 MG tablet Take by mouth.   diphenhydrAMINE-Phenylephrine (BENADRYL ALLERGY CON ULTRATABS PO) Take 1 tablet by mouth in the morning, at noon, and at bedtime.   hydroxypropyl methylcellulose / hypromellose (ISOPTO TEARS / GONIOVISC) 2.5 % ophthalmic solution Place 1 drop into both eyes 3 (three) times daily as needed for dry eyes.   MYRBETRIQ 50 MG TB24 tablet Take one tablet by mouth daily for bladder.   PARoxetine (PAXIL) 20 MG tablet TAKE 1 TABLET(20 MG) BY MOUTH DAILY   rizatriptan (MAXALT-MLT) 10 MG disintegrating tablet TAKE 1 TABLET BY MOUTH AS NEEDED FOR MIGRAINE. MAY REPEAT IN 2 HOURS IF NEEDED.   Tretinoin Microsphere Pump 0.1 % GEL Apply 1 Pump topically 3 (three) times daily at 8am, 2pm and bedtime.   No current facility-administered medications on file prior to visit.  Allergies:  Allergies  Allergen Reactions   Elemental Sulfur Hives, Itching and Swelling    - sulfa based antibiotic eye drop    Sulfa Antibiotics Other (See Comments)    Reaction unknown    Social History:  Social History   Socioeconomic History   Marital status: Widowed    Spouse name: Not on file   Number of children: Not on file   Years of education: Not on file   Highest education level: Not on file  Occupational History   Not on file  Tobacco Use   Smoking status: Every Day    Packs/day: 0.50    Years: 30.00    Pack years: 15.00    Types: Cigarettes   Smokeless tobacco: Never  Vaping Use   Vaping Use: Never used  Substance and Sexual Activity   Alcohol  use: No   Drug use: No   Sexual activity: Yes  Other Topics Concern   Not on file  Social History Narrative   Not on file   Social Determinants of Health   Financial Resource Strain: Not on file  Food Insecurity: Not on file  Transportation Needs: Not on file  Physical Activity: Not on file  Stress: Not on file  Social Connections: Not on file  Intimate Partner Violence: Not on file   Social History   Tobacco Use  Smoking Status Every Day   Packs/day: 0.50   Years: 30.00   Pack years: 15.00   Types: Cigarettes  Smokeless Tobacco Never   Social History   Substance and Sexual Activity  Alcohol Use No    Family History:  Family History  Problem Relation Age of Onset   Heart disease Mother    Hyperlipidemia Mother    Hypertension Mother    Hypertension Father    AAA (abdominal aortic aneurysm) Brother    Diabetes Brother    Depression Brother    Healthy Brother     Past medical history, surgical history, medications, allergies, family history and social history reviewed with patient today and changes made to appropriate areas of the chart.   Review of Systems  Constitutional: Negative.   HENT: Negative.    Eyes: Negative.   Respiratory: Negative.    Cardiovascular: Negative.   Gastrointestinal: Negative.   Genitourinary: Negative.   Musculoskeletal:  Positive for back pain and joint pain.  Skin: Negative.   Neurological: Negative.   Psychiatric/Behavioral: Negative.        Objective:    BP (!) 130/92 (BP Location: Left Arm, Patient Position: Sitting, Cuff Size: Normal)    Pulse 98    Temp 98.8 F (37.1 C) (Temporal)    Resp 17    Ht 5\' 6"  (1.676 m)    Wt 157 lb (71.2 kg)    SpO2 97%    BMI 25.34 kg/m   Wt Readings from Last 3 Encounters:  06/03/21 157 lb (71.2 kg)  01/12/21 145 lb (65.8 kg)  01/05/21 144 lb 9.6 oz (65.6 kg)    Physical Exam Vitals and nursing note reviewed.  Constitutional:      General: She is not in acute distress.     Appearance: Normal appearance. She is normal weight. She is not ill-appearing or toxic-appearing.  HENT:     Head: Normocephalic and atraumatic.     Right Ear: Tympanic membrane, ear canal and external ear normal.     Left Ear: Tympanic membrane, ear canal and external ear normal.     Nose: Nose normal.  No congestion or rhinorrhea.     Mouth/Throat:     Mouth: Mucous membranes are moist.     Pharynx: Oropharynx is clear. No oropharyngeal exudate.  Eyes:     General: No scleral icterus.    Extraocular Movements: Extraocular movements intact.     Pupils: Pupils are equal, round, and reactive to light.  Cardiovascular:     Rate and Rhythm: Normal rate and regular rhythm.     Pulses: Normal pulses.     Heart sounds: Normal heart sounds. No murmur heard. Pulmonary:     Effort: Pulmonary effort is normal. No respiratory distress.     Breath sounds: No wheezing or rhonchi.  Abdominal:     General: Abdomen is flat. Bowel sounds are normal. There is no distension.     Palpations: Abdomen is soft.     Tenderness: There is no abdominal tenderness.  Genitourinary:    Comments: Breast and GU exam deferred per patient request Musculoskeletal:        General: No swelling or tenderness. Normal range of motion.     Cervical back: Normal range of motion. No tenderness.     Right lower leg: No edema.     Left lower leg: No edema.  Skin:    General: Skin is warm and dry.     Capillary Refill: Capillary refill takes less than 2 seconds.     Coloration: Skin is not jaundiced or pale.  Neurological:     General: No focal deficit present.     Mental Status: She is alert and oriented to person, place, and time.     Motor: No weakness.     Gait: Gait normal.  Psychiatric:        Mood and Affect: Mood normal.        Behavior: Behavior normal.        Thought Content: Thought content normal.        Judgment: Judgment normal.      Assessment & Plan:   Problem List Items Addressed This Visit        Other   Tobacco use disorder    Chronic.  She continues to cut back on smoking.  She is hoping to completely stop smoking in the new year.       MDD (major depressive disorder)    Chronic.  PHQ-9 and GAD-7 not obtained today, however patients reports good control of mood with current medications.  Will give refill of lorazepam to use sparingly.  I discussed that this medication should not be for multiple times daily use.  Continue collaboration with therapy.  Follow up 3 months or sooner if refill needed.       Relevant Medications   LORazepam (ATIVAN) 1 MG tablet   GAD (generalized anxiety disorder)    Chronic.  PHQ-9 and GAD-7 not obtained today, however patients reports good control of mood with current medications.  Will give refill of lorazepam to use sparingly.  I discussed that this medication should not be for multiple times daily use.  Continue collaboration with therapy.  Follow up 3 months or sooner if refill needed.        Relevant Medications   LORazepam (ATIVAN) 1 MG tablet   Other Visit Diagnoses     Annual physical exam    -  Primary   Encounter for screening for lipid disorder       Relevant Orders   Lipid panel   Screening for deficiency anemia  Relevant Orders   CBC with Differential/Platelet   Screening for metabolic disorder       Relevant Orders   COMPLETE METABOLIC PANEL WITH GFR   Family history of diabetes mellitus       Relevant Orders   Hemoglobin A1c   Screening for thyroid disorder       Relevant Orders   TSH   Screening for colon cancer       Relevant Orders   Cologuard        Follow up plan: Return for pap and breast exam.   LABORATORY TESTING:  - Pap smear:  not up to date; patient wishes to schedule at a later date  IMMUNIZATIONS:   - Tdap: Tetanus vaccination status reviewed: last tetanus booster within 10 years. - Influenza: Administered today - Pneumovax: Up to date - Prevnar: Administered today - HPV: Not  applicable - Zostavax vaccine:  has had 1 dose, waiting until after follow up with neurosurgeon for second dose - COVID-19 vaccine: has had 2 vaccines  SCREENING: -Mammogram: Up to date  - Colonoscopy: Ordered today - requests Cologuard - Bone Density: Not applicable  -Hearing Test: Not applicable  -Spirometry: Not applicable   PATIENT COUNSELING:    Advised to avoid cigarette smoking.   Dental health: Discussed importance of regular tooth brushing, flossing, and dental visits.    NEXT PREVENTATIVE PHYSICAL DUE IN 1 YEAR. Return for pap and breast exam.

## 2021-06-04 ENCOUNTER — Encounter: Payer: Self-pay | Admitting: Nurse Practitioner

## 2021-06-04 DIAGNOSIS — R7303 Prediabetes: Secondary | ICD-10-CM | POA: Insufficient documentation

## 2021-06-04 LAB — COMPLETE METABOLIC PANEL WITH GFR
AG Ratio: 1.5 (calc) (ref 1.0–2.5)
ALT: 12 U/L (ref 6–29)
AST: 16 U/L (ref 10–35)
Albumin: 4.1 g/dL (ref 3.6–5.1)
Alkaline phosphatase (APISO): 108 U/L (ref 37–153)
BUN: 9 mg/dL (ref 7–25)
CO2: 27 mmol/L (ref 20–32)
Calcium: 9.2 mg/dL (ref 8.6–10.4)
Chloride: 105 mmol/L (ref 98–110)
Creat: 0.79 mg/dL (ref 0.50–1.05)
Globulin: 2.8 g/dL (calc) (ref 1.9–3.7)
Glucose, Bld: 76 mg/dL (ref 65–99)
Potassium: 3.9 mmol/L (ref 3.5–5.3)
Sodium: 142 mmol/L (ref 135–146)
Total Bilirubin: 0.4 mg/dL (ref 0.2–1.2)
Total Protein: 6.9 g/dL (ref 6.1–8.1)
eGFR: 86 mL/min/{1.73_m2} (ref >=60)

## 2021-06-04 LAB — LIPID PANEL
Cholesterol: 244 mg/dL — ABNORMAL HIGH (ref <200)
HDL: 55 mg/dL (ref >=50)
LDL Cholesterol (Calc): 159 mg/dL (calc) — ABNORMAL HIGH
Non-HDL Cholesterol (Calc): 189 mg/dL (calc) — ABNORMAL HIGH (ref <130)
Total CHOL/HDL Ratio: 4.4 (calc) (ref <5.0)
Triglycerides: 161 mg/dL — ABNORMAL HIGH (ref <150)

## 2021-06-04 LAB — CBC WITH DIFFERENTIAL/PLATELET
Absolute Monocytes: 524 cells/uL (ref 200–950)
Basophils Absolute: 39 cells/uL (ref 0–200)
Basophils Relative: 0.5 %
Eosinophils Absolute: 200 cells/uL (ref 15–500)
Eosinophils Relative: 2.6 %
HCT: 39.8 % (ref 35.0–45.0)
Hemoglobin: 13.7 g/dL (ref 11.7–15.5)
Lymphs Abs: 3042 cells/uL (ref 850–3900)
MCH: 31.9 pg (ref 27.0–33.0)
MCHC: 34.4 g/dL (ref 32.0–36.0)
MCV: 92.6 fL (ref 80.0–100.0)
MPV: 10 fL (ref 7.5–12.5)
Monocytes Relative: 6.8 %
Neutro Abs: 3896 cells/uL (ref 1500–7800)
Neutrophils Relative %: 50.6 %
Platelets: 308 10*3/uL (ref 140–400)
RBC: 4.3 10*6/uL (ref 3.80–5.10)
RDW: 13.6 % (ref 11.0–15.0)
Total Lymphocyte: 39.5 %
WBC: 7.7 10*3/uL (ref 3.8–10.8)

## 2021-06-04 LAB — HEMOGLOBIN A1C
Hgb A1c MFr Bld: 5.7 % of total Hgb — ABNORMAL HIGH (ref <5.7)
Mean Plasma Glucose: 117 mg/dL
eAG (mmol/L): 6.5 mmol/L

## 2021-06-04 LAB — TSH: TSH: 1.63 mIU/L (ref 0.40–4.50)

## 2021-06-08 ENCOUNTER — Telehealth: Payer: Self-pay | Admitting: Nurse Practitioner

## 2021-06-08 NOTE — Telephone Encounter (Signed)
Patient called to request refill of LORazepam (ATIVAN) 1 MG tablet [[id number]] .   Also needs new rx filled for cholesterol; unsure of name. Message received on MyChart asking if patient willing to take medication; patient agreed to take but was unsure of how to reply in [place].    Pharmacy confirmed as  [facility] [place] [facility]  [address] [place] [address]  Phone:  [phone]  Fax:  [phone]  DEA #:  [id number]  Please advise at [phone]

## 2021-06-09 MED ORDER — ATORVASTATIN CALCIUM 10 MG PO TABS: 10.0000 mg | ORAL_TABLET | Freq: Every day | ORAL | 1 refills | Status: AC

## 2021-06-09 NOTE — Telephone Encounter (Signed)
Atorvastatin sent to pharmacy.  Patient informed.

## 2021-06-09 NOTE — Telephone Encounter (Signed)
Please send in atorvastatin 10 mg daily #90.  Recommend she recheck cholesterol in 6 months.  Ativan was sent in on 06/03/2021 - please confirm pharmacy has received.

## 2021-06-16 ENCOUNTER — Other Ambulatory Visit: Payer: Self-pay

## 2021-06-16 ENCOUNTER — Ambulatory Visit (INDEPENDENT_AMBULATORY_CARE_PROVIDER_SITE_OTHER): Payer: Managed Care, Other (non HMO) | Admitting: Nurse Practitioner

## 2021-06-16 ENCOUNTER — Encounter: Payer: Self-pay | Admitting: Nurse Practitioner

## 2021-06-16 VITALS — BP 138/88 | HR 95 | Ht 66.0 in | Wt 161.0 lb

## 2021-06-16 DIAGNOSIS — R030 Elevated blood-pressure reading, without diagnosis of hypertension: Secondary | ICD-10-CM

## 2021-06-16 DIAGNOSIS — E785 Hyperlipidemia, unspecified: Secondary | ICD-10-CM | POA: Diagnosis not present

## 2021-06-16 DIAGNOSIS — Z124 Encounter for screening for malignant neoplasm of cervix: Secondary | ICD-10-CM

## 2021-06-16 NOTE — Progress Notes (Signed)
Subjective:    Patient ID: Kristy Butler, female    DOB: 1961-02-20, 61 y.o.   MRN: [id number]  HPI: Kristy Butler is a 61 y.o. female presenting for cervical cancer screening.  Chief Complaint  Patient presents with   Gynecologic Exam   Patient presents for pap exam.  She does not have any vaginal complaints today.  She is wondering when she can stop having pap smears.   She is curious what kinds of foods she should be eating to watch her cholesterol level.  She has start atorvastatin 10 mg daily and denies issues with the medication so far.  Allergies  Allergen Reactions   Elemental Sulfur Hives, Itching and Swelling    - sulfa based antibiotic eye drop    Sulfa Antibiotics Other (See Comments)    Reaction unknown    Outpatient Encounter Medications as of 06/16/2021  Medication Sig   atorvastatin (LIPITOR) 10 MG tablet Take 1 tablet (10 mg total) by mouth daily.   buPROPion (WELLBUTRIN SR) 150 MG 12 hr tablet TAKE 1 TABLET(150 MG) BY MOUTH TWICE DAILY   cyclobenzaprine (FLEXERIL) 10 MG tablet Take by mouth.   diphenhydrAMINE-Phenylephrine (BENADRYL ALLERGY CON ULTRATABS PO) Take 1 tablet by mouth in the morning, at noon, and at bedtime.   hydroxypropyl methylcellulose / hypromellose (ISOPTO TEARS / GONIOVISC) 2.5 % ophthalmic solution Place 1 drop into both eyes 3 (three) times daily as needed for dry eyes.   LORazepam (ATIVAN) 1 MG tablet Take 1 tablet (1 mg total) by mouth daily as needed for anxiety.   MYRBETRIQ 50 MG TB24 tablet Take one tablet by mouth daily for bladder.   PARoxetine (PAXIL) 20 MG tablet TAKE 1 TABLET(20 MG) BY MOUTH DAILY   rizatriptan (MAXALT-MLT) 10 MG disintegrating tablet TAKE 1 TABLET BY MOUTH AS NEEDED FOR MIGRAINE. MAY REPEAT IN 2 HOURS IF NEEDED.   Tretinoin Microsphere Pump 0.1 % GEL Apply 1 Pump topically 3 (three) times daily at 8am, 2pm and bedtime.   No facility-administered encounter medications on file as of 06/16/2021.    Patient  Active Problem List   Diagnosis Date Noted   Prediabetes 06/04/2021   Lumbar radiculopathy 01/12/2021   Migraine without aura and without status migrainosus, not intractable 12/30/2020   Spondylolisthesis, lumbar region 11/19/2020   Mild hyperlipidemia 05/14/2019   Osteopenia 05/14/2019   Generalized OA 05/14/2019   Mixed incontinence 11/21/2018   Adult acne 11/21/2018   Tobacco use disorder 01/22/2018   Migraines 04/24/2017   GAD (generalized anxiety disorder) 04/24/2017   Abnormal stress test    MDD (major depressive disorder) 09/29/2015    Past Medical History:  Diagnosis Date   Anxiety    Arthritis    "top of my neck, spine, right knee" (04/04/2013)   Depression    GERD (gastroesophageal reflux disease)    Migraines    "last one was ~ 5 yr ago" (04/04/2013)   Shortness of breath    "just related to my heart issues right now" (04/04/2013)   Urinary incontinence     Relevant past medical, surgical, family and social history reviewed and updated as indicated. Interim medical history since our last visit reviewed.  Review of Systems Per HPI unless specifically indicated above     Objective:    BP (!) 144/90    Pulse 95    Ht 5\' 6"  (1.676 m)    Wt 161 lb (73 kg)    SpO2 95%    BMI 25.99  kg/m   Wt Readings from Last 3 Encounters:  06/16/21 161 lb (73 kg)  06/03/21 157 lb (71.2 kg)  01/12/21 145 lb (65.8 kg)    Physical Exam Vitals and nursing note reviewed. Exam conducted with a chaperone present Elizabeth Palau, CMA).  Constitutional:      General: She is not in acute distress.    Appearance: Normal appearance. She is not toxic-appearing.  Chest:     Comments: Declines breast exam Genitourinary:    General: Normal vulva.     Labia:        Right: No rash or tenderness.        Left: No rash or tenderness.      Vagina: Normal. No vaginal discharge.     Cervix: Normal.     Uterus: Normal.      Adnexa: Right adnexa normal and left adnexa normal.       Right:  No mass or tenderness.         Left: No mass or tenderness.    Lymphadenopathy:     Lower Body: No right inguinal adenopathy. No left inguinal adenopathy.  Skin:    General: Skin is warm and dry.     Coloration: Skin is not jaundiced or pale.     Findings: No erythema.  Neurological:     Mental Status: She is alert and oriented to person, place, and time.  Psychiatric:        Mood and Affect: Mood normal.        Thought Content: Thought content normal.        Judgment: Judgment normal.       Assessment & Plan:   Problem List Items Addressed This Visit       Other   Mild hyperlipidemia    Discussed and encouraged heart healthy diet and limiting intake of saturated fats.  Also discussed and encouraged smoking cessation and low sodium diet.       Other Visit Diagnoses     Screening for cervical cancer    -  Primary   Relevant Orders   PAP,TP IMGw/HPV RNA,rflx [id number],97/94   Elevated blood pressure reading without diagnosis of hypertension       Recheck multiple times in office today.  Pt to check BP at home and notify us/future PCP if >130/80 consistently.  Discussed heart healthy diet.        Follow up plan: Return for with new pcp.

## 2021-06-16 NOTE — Assessment & Plan Note (Addendum)
Discussed and encouraged heart healthy diet and limiting intake of saturated fats.  Also discussed and encouraged smoking cessation and low sodium diet.

## 2021-06-18 LAB — PAP, TP IMAGING W/ HPV RNA, RFLX HPV TYPE 16,18/45: HPV DNA High Risk: NOT DETECTED

## 2021-06-26 LAB — COLOGUARD: COLOGUARD: NEGATIVE

## 2021-07-02 ENCOUNTER — Other Ambulatory Visit: Payer: Self-pay

## 2021-07-02 ENCOUNTER — Encounter: Payer: Self-pay | Admitting: Nurse Practitioner

## 2021-07-02 ENCOUNTER — Ambulatory Visit (INDEPENDENT_AMBULATORY_CARE_PROVIDER_SITE_OTHER): Payer: Managed Care, Other (non HMO) | Admitting: Nurse Practitioner

## 2021-07-02 VITALS — BP 120/76 | HR 117 | Ht 66.0 in | Wt 168.0 lb

## 2021-07-02 DIAGNOSIS — J0100 Acute maxillary sinusitis, unspecified: Secondary | ICD-10-CM | POA: Diagnosis not present

## 2021-07-02 MED ORDER — AMOXICILLIN-POT CLAVULANATE 875-125 MG PO TABS: 1.0000 | ORAL_TABLET | Freq: Two times a day (BID) | ORAL | 0 refills | Status: AC

## 2021-07-02 NOTE — Progress Notes (Signed)
Subjective:    Patient ID: Kristy Butler, female    DOB: 08-10-1960, 61 y.o.   MRN: [id number]  HPI: Kristy Butler is a 61 y.o. female presenting for sinus pain on left side.  Chief Complaint  Patient presents with   Sinus Problem   SINUSITIS Onset: 2-3 days ago Fever: no Cough: no Shortness of breath: no Wheezing: no Chest pain: no Chest tightness: no Chest congestion: no Nasal congestion: yes Runny nose: no Post nasal drip: yes Sneezing: no Sore throat: no Swollen glands: yes Sinus pressure: yes; left cheek and above left eye Headache: no Face pain: no Toothache: no Ear pain:  yes; left   Ear pressure: no  Eyes red/itching:no Eye drainage/crusting: no  Nausea: no  Vomiting: no Diarrhea: no  Change in appetite: yes; decreased Loss of taste/smell:  yes Rash: no Fatigue: yes Sick contacts: no Strep contacts: no  Context: stable Recurrent sinusitis: no Treatments attempted: Benadryl decongestant Relief with OTC medications: yes  Allergies  Allergen Reactions   Elemental Sulfur Hives, Itching and Swelling    - sulfa based antibiotic eye drop    Sulfa Antibiotics Other (See Comments)    Reaction unknown    Outpatient Encounter Medications as of 07/02/2021  Medication Sig   amoxicillin-clavulanate (AUGMENTIN) 875-125 MG tablet Take 1 tablet by mouth 2 (two) times daily for 7 days.   atorvastatin (LIPITOR) 10 MG tablet Take 1 tablet (10 mg total) by mouth daily.   buPROPion (WELLBUTRIN SR) 150 MG 12 hr tablet TAKE 1 TABLET(150 MG) BY MOUTH TWICE DAILY   cyclobenzaprine (FLEXERIL) 10 MG tablet Take by mouth.   diphenhydrAMINE-Phenylephrine (BENADRYL ALLERGY CON ULTRATABS PO) Take 1 tablet by mouth in the morning, at noon, and at bedtime.   hydroxypropyl methylcellulose / hypromellose (ISOPTO TEARS / GONIOVISC) 2.5 % ophthalmic solution Place 1 drop into both eyes 3 (three) times daily as needed for dry eyes.   LORazepam (ATIVAN) 1 MG tablet Take 1  tablet (1 mg total) by mouth daily as needed for anxiety.   MYRBETRIQ 50 MG TB24 tablet Take one tablet by mouth daily for bladder.   PARoxetine (PAXIL) 20 MG tablet TAKE 1 TABLET(20 MG) BY MOUTH DAILY   rizatriptan (MAXALT-MLT) 10 MG disintegrating tablet TAKE 1 TABLET BY MOUTH AS NEEDED FOR MIGRAINE. MAY REPEAT IN 2 HOURS IF NEEDED.   Tretinoin Microsphere Pump 0.1 % GEL Apply 1 Pump topically 3 (three) times daily at 8am, 2pm and bedtime.   No facility-administered encounter medications on file as of 07/02/2021.    Patient Active Problem List   Diagnosis Date Noted   Prediabetes 06/04/2021   Lumbar radiculopathy 01/12/2021   Migraine without aura and without status migrainosus, not intractable 12/30/2020   Spondylolisthesis, lumbar region 11/19/2020   Mild hyperlipidemia 05/14/2019   Osteopenia 05/14/2019   Generalized OA 05/14/2019   Mixed incontinence 11/21/2018   Adult acne 11/21/2018   Tobacco use disorder 01/22/2018   Migraines 04/24/2017   GAD (generalized anxiety disorder) 04/24/2017   Abnormal stress test    MDD (major depressive disorder) 09/29/2015    Past Medical History:  Diagnosis Date   Anxiety    Arthritis    "top of my neck, spine, right knee" (04/04/2013)   Depression    GERD (gastroesophageal reflux disease)    Migraines    "last one was ~ 5 yr ago" (04/04/2013)   Shortness of breath    "just related to my heart issues right now" (04/04/2013)  Urinary incontinence     Relevant past medical, surgical, family and social history reviewed and updated as indicated. Interim medical history since our last visit reviewed.  Review of Systems Per HPI unless specifically indicated above     Objective:    BP 120/76    Pulse (!) 117    Ht 5\' 6"  (1.676 m)    Wt 168 lb (76.2 kg)    SpO2 99%    BMI 27.12 kg/m   Wt Readings from Last 3 Encounters:  07/02/21 168 lb (76.2 kg)  06/16/21 161 lb (73 kg)  06/03/21 157 lb (71.2 kg)    Physical Exam Vitals and  nursing note reviewed.  Constitutional:      General: She is not in acute distress.    Appearance: Normal appearance. She is not toxic-appearing.  HENT:     Head: Normocephalic and atraumatic.     Right Ear: Tympanic membrane, ear canal and external ear normal.     Left Ear: No decreased hearing noted. Tympanic membrane is injected and erythematous. Tympanic membrane is not retracted.     Nose: Congestion present. No rhinorrhea.     Right Turbinates: Not enlarged, swollen or pale.     Left Turbinates: Swollen. Not enlarged or pale.     Right Sinus: No maxillary sinus tenderness or frontal sinus tenderness.     Left Sinus: Maxillary sinus tenderness and frontal sinus tenderness present.     Mouth/Throat:     Mouth: Mucous membranes are moist.     Pharynx: Oropharynx is clear. No oropharyngeal exudate or posterior oropharyngeal erythema.  Eyes:     General: No scleral icterus.    Extraocular Movements: Extraocular movements intact.  Cardiovascular:     Rate and Rhythm: Normal rate and regular rhythm.     Heart sounds: Normal heart sounds. No murmur heard. Pulmonary:     Effort: Pulmonary effort is normal. No respiratory distress.     Breath sounds: Normal breath sounds. No wheezing, rhonchi or rales.  Lymphadenopathy:     Cervical: Cervical adenopathy present.  Skin:    General: Skin is warm and dry.     Capillary Refill: Capillary refill takes less than 2 seconds.     Coloration: Skin is not jaundiced or pale.     Findings: No erythema.  Neurological:     Mental Status: She is alert and oriented to person, place, and time.     Motor: No weakness.     Gait: Gait normal.      Assessment & Plan:  1. Acute non-recurrent maxillary sinusitis Acute.  Although symptoms started earlier this week, history and examination and consistent with bacterial sinus infection.  Start Augmentin twice daily for 7 days.  Continue saline rinses/humidifier.  Start Mucinex to help with congestion.   Follow up with no improvement after treatment.  - amoxicillin-clavulanate (AUGMENTIN) 875-125 MG tablet; Take 1 tablet by mouth 2 (two) times daily for 7 days.  Dispense: 14 tablet; Refill: 0    Follow up plan: Return if symptoms worsen or fail to improve.

## 2021-07-14 ENCOUNTER — Telehealth: Payer: Self-pay

## 2021-07-14 NOTE — Telephone Encounter (Signed)
Alvina Chou Key: [id number] - [place] Case ID: [id number] - Rx #: [id number]  L70.9 - Acne, unspecified  Outcome Approved today CaseId:[id number];Status:Approved;Review Type:Prior Auth;Coverage Start Date:07/14/2021;Coverage End Date:07/14/2022;

## 2022-03-11 ENCOUNTER — Other Ambulatory Visit: Payer: Self-pay | Admitting: Family Medicine

## 2022-03-11 NOTE — Telephone Encounter (Signed)
Not a provider in this practice. Requested Prescriptions  Pending Prescriptions Disp Refills  . tretinoin microspheres (RETIN-A MICRO) 0.1 % gel [Pharmacy Med Name: TRETINOIN MICRO 0.1% GEL PUMP 50GM] 50 g 3    Sig: APPLY 1 PUMP TOPICALLY THREE TIMES DAILY AT 8 AM, 2 PM, AND AT BEDTIME     Dermatology:  Acne preparations Passed - 03/11/2022 10:28 AM      Passed - Valid encounter within last 12 months    Recent Outpatient Visits          8 months ago Acute non-recurrent maxillary sinusitis   [facility] Eulogio Bear, NP   9 months ago Annual physical exam   [facility] Eulogio Bear, NP   1 year ago Moderate episode of recurrent major depressive disorder ([place])   [facility] E[facility]ogio Bear, NP   1 year ago GAD (generalized anxiety disorder)   [place], [place] Nunnery, MD   1 year ago Right hip pain   [facility] [place], [place] Nunnery, MD

## 2022-03-18 ENCOUNTER — Other Ambulatory Visit: Payer: Self-pay | Admitting: Family Medicine

## 2022-03-25 ENCOUNTER — Other Ambulatory Visit: Payer: Self-pay | Admitting: Internal Medicine

## 2022-03-25 DIAGNOSIS — Z1231 Encounter for screening mammogram for malignant neoplasm of breast: Secondary | ICD-10-CM

## 2022-05-16 ENCOUNTER — Ambulatory Visit
Admission: RE | Admit: 2022-05-16 | Discharge: 2022-05-16 | Disposition: A | Discharge status: Home / Self Care | Payer: Managed Care, Other (non HMO) | Source: Ambulatory Visit | Attending: Internal Medicine | Admitting: Internal Medicine

## 2022-05-16 DIAGNOSIS — Z1231 Encounter for screening mammogram for malignant neoplasm of breast: Secondary | ICD-10-CM

## 2022-06-15 ENCOUNTER — Other Ambulatory Visit: Payer: Self-pay | Admitting: Internal Medicine

## 2022-06-15 DIAGNOSIS — Z122 Encounter for screening for malignant neoplasm of respiratory organs: Secondary | ICD-10-CM

## 2023-06-21 ENCOUNTER — Other Ambulatory Visit: Payer: Self-pay | Admitting: Internal Medicine

## 2023-06-21 DIAGNOSIS — Z1231 Encounter for screening mammogram for malignant neoplasm of breast: Secondary | ICD-10-CM

## 2023-06-21 DIAGNOSIS — E2839 Other primary ovarian failure: Secondary | ICD-10-CM

## 2023-07-12 ENCOUNTER — Ambulatory Visit: Payer: Managed Care, Other (non HMO)

## 2023-07-25 ENCOUNTER — Ambulatory Visit: Payer: Commercial Managed Care - HMO

## 2023-08-01 ENCOUNTER — Ambulatory Visit
Admission: RE | Admit: 2023-08-01 | Discharge: 2023-08-01 | Disposition: A | Discharge status: Home / Self Care | Payer: Commercial Managed Care - HMO | Source: Ambulatory Visit | Attending: Internal Medicine | Admitting: Internal Medicine

## 2023-08-01 DIAGNOSIS — Z1231 Encounter for screening mammogram for malignant neoplasm of breast: Secondary | ICD-10-CM

## 2024-02-07 ENCOUNTER — Other Ambulatory Visit: Payer: Managed Care, Other (non HMO)

## 2024-08-08 ENCOUNTER — Ambulatory Visit (HOSPITAL_BASED_OUTPATIENT_CLINIC_OR_DEPARTMENT_OTHER)
